# Patient Record
Sex: Male | Born: 1965
Health system: Southern US, Community
[De-identification: ages and names within clinical notes are randomized; demographics above are authoritative.]

## PROBLEM LIST (undated history)

## (undated) HISTORY — PX: VASECTOMY: SHX75

---

## 2004-02-13 ENCOUNTER — Ambulatory Visit: Payer: Self-pay | Admitting: Family Medicine

## 2004-02-19 ENCOUNTER — Ambulatory Visit: Payer: Self-pay | Admitting: Unknown Physician Specialty

## 2005-01-16 ENCOUNTER — Ambulatory Visit: Payer: Self-pay | Admitting: Family Medicine

## 2006-12-14 IMAGING — NM NUCLEAR MEDICINE HEPATOHBILIARY INCLUDE GB
3 series · 21 of 21 positions shown · non-contrast
Comparison: none

REASON FOR EXAM: Epigastric abdominal pain
COMMENTS:

[Series 1: gallbladder dynamic (results) · 4.80mm/px · 6 of 60 frames shown]
[frame 6/60]
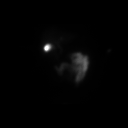
[frame 16/60]
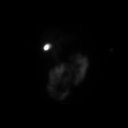
[frame 26/60]
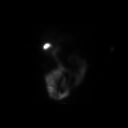
[frame 36/60]
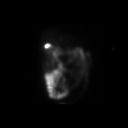
[frame 46/60]
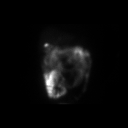
[frame 56/60]
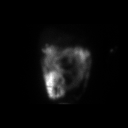

[Series 1: gallbladder statics · 4.80mm/px · 9 of 9 slices shown]
[im 1/9]
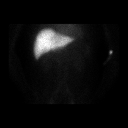
[im 2/9]
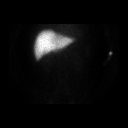
[im 3/9]
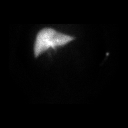
[im 4/9]
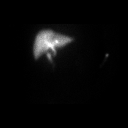
[im 5/9]
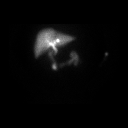
[im 6/9]
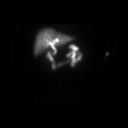
[im 7/9]
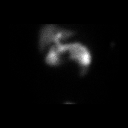
[im 8/9]
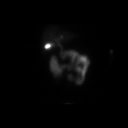
[im 9/9]
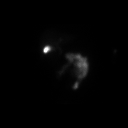

[Series 1: gallbladder dynamic · 4.80mm/px · 6 of 60 frames shown]
[frame 6/60]
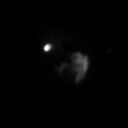
[frame 16/60]
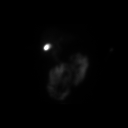
[frame 26/60]
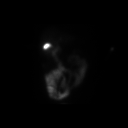
[frame 36/60]
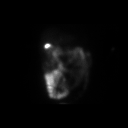
[frame 46/60]
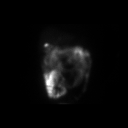
[frame 56/60]
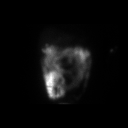

[21 of 21 positions shown; findings below may reference images not displayed]

PROCEDURE:     NM  - NM HEPATO WITH GB EJECT FRACTION  - January 16, 2005 [DATE]

RESULT:     The patient received 7.93 millicuries of technetium-labeled
Choletec and standard anterior images of the abdomen were obtained.  Diffuse
radiotracer activity is appreciated throughout the liver.  Drainage of
radiotracer into the common bile duct is demonstrated at 10 minutes with
increased intensity appreciated in the intrahepatic and extrahepatic biliary
system.  Drainage of radiotracer into the small bowel is appreciated at 15
minutes with increased intensity demonstrated and peristalsis.  The
gallbladder is appreciated filling the radiotracer at approximately 50
minutes with increased activity demonstrated at 60 minutes.

The patient received 1.7 micrograms of sincalide IV over 30 minutes and a
gallbladder ejection fraction was measured at 86%.
IMPRESSION: Hepatobiliary scan as well as a gallbladder ejection fraction within normal
limits as described above.

## 2010-12-22 ENCOUNTER — Ambulatory Visit: Payer: Self-pay | Admitting: Unknown Physician Specialty

## 2010-12-22 LAB — HM COLONOSCOPY

## 2014-03-13 LAB — LIPID PANEL
CHOLESTEROL: 183 mg/dL (ref 0–200)
HDL: 50 mg/dL (ref 35–70)
LDL Cholesterol: 123 mg/dL
LDl/HDL Ratio: 2.5
Triglycerides: 48 mg/dL (ref 40–160)

## 2014-03-13 LAB — TSH: TSH: 1.82 u[IU]/mL (ref 0.41–5.90)

## 2014-03-13 LAB — BASIC METABOLIC PANEL
BUN: 25 mg/dL — AB (ref 4–21)
CREATININE: 1 mg/dL (ref 0.6–1.3)
Glucose: 95 mg/dL
Potassium: 5.1 mmol/L (ref 3.4–5.3)
SODIUM: 139 mmol/L (ref 137–147)

## 2014-03-13 LAB — CBC AND DIFFERENTIAL
HCT: 47 % (ref 41–53)
HEMOGLOBIN: 16 g/dL (ref 13.5–17.5)
Neutrophils Absolute: 3 /uL
Platelets: 249 10*3/uL (ref 150–399)
WBC: 4.6 10*3/mL

## 2014-03-13 LAB — HEPATIC FUNCTION PANEL
ALT: 16 U/L (ref 10–40)
AST: 21 U/L (ref 14–40)
Alkaline Phosphatase: 54 U/L (ref 25–125)

## 2014-03-13 LAB — PSA: PSA: 1

## 2014-06-21 ENCOUNTER — Other Ambulatory Visit: Payer: Self-pay | Admitting: Family Medicine

## 2014-09-06 DIAGNOSIS — K219 Gastro-esophageal reflux disease without esophagitis: Secondary | ICD-10-CM | POA: Insufficient documentation

## 2014-09-06 DIAGNOSIS — M722 Plantar fascial fibromatosis: Secondary | ICD-10-CM | POA: Insufficient documentation

## 2014-09-06 DIAGNOSIS — F411 Generalized anxiety disorder: Secondary | ICD-10-CM | POA: Insufficient documentation

## 2014-09-06 DIAGNOSIS — G4700 Insomnia, unspecified: Secondary | ICD-10-CM | POA: Insufficient documentation

## 2014-09-06 DIAGNOSIS — N400 Enlarged prostate without lower urinary tract symptoms: Secondary | ICD-10-CM | POA: Insufficient documentation

## 2014-09-12 ENCOUNTER — Encounter: Payer: Self-pay | Admitting: Family Medicine

## 2014-09-12 ENCOUNTER — Ambulatory Visit (INDEPENDENT_AMBULATORY_CARE_PROVIDER_SITE_OTHER): Payer: BLUE CROSS/BLUE SHIELD | Admitting: Family Medicine

## 2014-09-12 VITALS — BP 112/70 | HR 72 | Temp 98.3°F | Resp 16 | Wt 190.0 lb

## 2014-09-12 DIAGNOSIS — F411 Generalized anxiety disorder: Secondary | ICD-10-CM

## 2014-09-12 DIAGNOSIS — G4700 Insomnia, unspecified: Secondary | ICD-10-CM

## 2014-09-12 MED ORDER — CLONAZEPAM 1 MG PO TABS: 1.0000 mg | ORAL_TABLET | Freq: Every day | ORAL | Status: DC

## 2014-09-12 NOTE — Progress Notes (Signed)
Patient ID: William Thompson, male   DOB: 16-Apr-1965, 49 y.o.   MRN: [id number]    Subjective:  HPI Pt reports that he is here today for a 6 month follow up for anxiety. He needs a refill on his clonazepam. He reports that he takes it to sleep and for prostatitis, and his urologist told him to stay on it.   Prior to Admission medications   Medication Sig Start Date End Date Taking? Authorizing Provider  aspirin 81 MG tablet Take by mouth. 03/25/11  Yes Historical Provider, MD  clonazePAM (KLONOPIN) 1 MG tablet Take by mouth. 03/27/14  Yes Historical Provider, MD  DiphenhydrAMINE HCl, Sleep, 50 MG CAPS Take by mouth.   Yes Historical Provider, MD  PRILOSEC OTC 20 MG tablet TAKE ONE TABLET TWICE A DAY 06/21/14  Yes Richard Hulen Shouts., MD    Patient Active Problem List   Diagnosis Date Noted  . Benign fibroma of prostate 09/06/2014  . Insomnia, persistent 09/06/2014  . Anxiety, generalized 09/06/2014  . Acid reflux 09/06/2014  . Plantar fasciitis 09/06/2014    History reviewed. No pertinent past medical history.  Social History   Social History  . Marital Status: Married    Spouse Name: N/A  . Number of Children: N/A  . Years of Education: N/A   Occupational History  . Not on file.   Social History Main Topics  . Smoking status: Never Smoker   . Smokeless tobacco: Not on file  . Alcohol Use: No  . Drug Use: No  . Sexual Activity: Not on file   Other Topics Concern  . Not on file   Social History Narrative    No Known Allergies  Review of Systems  Constitutional: Negative.   HENT: Positive for congestion.        He feels like he is getting a head cold.   Eyes: Negative.   Respiratory: Negative.   Cardiovascular: Negative.   Gastrointestinal: Negative.   Genitourinary: Negative.   Musculoskeletal: Negative.   Skin: Negative.   Neurological: Negative.   Endo/Heme/Allergies: Negative.   Psychiatric/Behavioral: Negative.     Immunization History    Administered Date(s) Administered  . Tdap 09/07/2007   Objective:  BP 112/70 mmHg  Pulse 72  Temp(Src) 98.3 F (36.8 C) (Oral)  Resp 16  Wt 190 lb (86.183 kg)  Physical Exam  Constitutional: He is oriented to person, place, and time and well-developed, well-nourished, and in no distress.  HENT:  Head: Normocephalic and atraumatic.  Right Ear: External ear normal.  Left Ear: External ear normal.  Nose: Nose normal.  Eyes: Conjunctivae are normal.  Neck: Neck supple.  Cardiovascular: Normal rate, regular rhythm and normal heart sounds.   Pulmonary/Chest: Effort normal and breath sounds normal.  Abdominal: Soft.  Neurological: He is alert and oriented to person, place, and time.  Skin: Skin is warm and dry.  Psychiatric: Mood, memory, affect and judgment normal.    Lab Results  Component Value Date   WBC 4.6 03/13/2014   HGB 16.0 03/13/2014   HCT 47 03/13/2014   PLT 249 03/13/2014   CHOL 183 03/13/2014   TRIG 48 03/13/2014   HDL 50 03/13/2014   LDLCALC 123 03/13/2014   TSH 1.82 03/13/2014   PSA 1.0 03/13/2014    CMP     Component Value Date/Time   NA 139 03/13/2014   K 5.1 03/13/2014   BUN 25* 03/13/2014   CREATININE 1.0 03/13/2014   AST 21 03/13/2014  ALT 16 03/13/2014   ALKPHOS 54 03/13/2014    Assessment and Plan :  Chronic insomnia and chronic anxiety Both improved and controlled with present medications. Refill for 6 months. GERD Chronic issue in this slender patient. Consider trying Zantac twice a day instead of omeprazole. Discussed new findings about chronic PPI use.CPE 6 months.  Julieanne Manson MD [facility] 09/12/2014 8:25 AM

## 2015-03-13 ENCOUNTER — Other Ambulatory Visit: Payer: Self-pay | Admitting: Family Medicine

## 2015-03-14 NOTE — Telephone Encounter (Signed)
Printed, please fax or call in to pharmacy. Thank you.   

## 2015-03-14 NOTE — Telephone Encounter (Signed)
Dr. Judie PetitM  This is a patient of Dr Reece AgarG.  He was seen in Sept for his anxiety.  Can you please refill his Clonopin 1 mg 1 q hs, may take q 6 h prn.  # 60 Thanks ED

## 2015-03-18 ENCOUNTER — Ambulatory Visit (INDEPENDENT_AMBULATORY_CARE_PROVIDER_SITE_OTHER): Payer: BLUE CROSS/BLUE SHIELD | Admitting: Family Medicine

## 2015-03-18 ENCOUNTER — Encounter: Payer: Self-pay | Admitting: Family Medicine

## 2015-03-18 ENCOUNTER — Telehealth: Payer: Self-pay

## 2015-03-18 VITALS — BP 104/64 | HR 64 | Temp 98.1°F | Resp 12 | Ht 71.5 in | Wt 194.0 lb

## 2015-03-18 DIAGNOSIS — Z125 Encounter for screening for malignant neoplasm of prostate: Secondary | ICD-10-CM | POA: Diagnosis not present

## 2015-03-18 DIAGNOSIS — Z8042 Family history of malignant neoplasm of prostate: Secondary | ICD-10-CM | POA: Diagnosis not present

## 2015-03-18 DIAGNOSIS — Z0000 Encounter for general adult medical examination without abnormal findings: Secondary | ICD-10-CM | POA: Diagnosis not present

## 2015-03-18 DIAGNOSIS — K219 Gastro-esophageal reflux disease without esophagitis: Secondary | ICD-10-CM

## 2015-03-18 DIAGNOSIS — Z1211 Encounter for screening for malignant neoplasm of colon: Secondary | ICD-10-CM | POA: Diagnosis not present

## 2015-03-18 MED ORDER — OMEPRAZOLE 20 MG PO CPDR: 20.0000 mg | DELAYED_RELEASE_CAPSULE | Freq: Every day | ORAL | Status: DC

## 2015-03-18 MED ORDER — CLONAZEPAM 1 MG PO TABS: 1.0000 mg | ORAL_TABLET | Freq: Two times a day (BID) | ORAL | Status: DC | PRN

## 2015-03-18 NOTE — Progress Notes (Signed)
Patient ID: William CornerJoseph A Hageman, male   DOB: 1965/04/11, 50 y.o.   MRN: [id number]  Visit Date: 03/18/2015  Today's Provider: Megan Mansichard Kwynn Schlotter Jr, MD   Chief Complaint  Patient presents with  . Annual Exam   Subjective:  William Thompson is a 50 y.o. male who presents today for health maintenance and complete physical. He feels well. He reports exercising runs mainly. He reports he is sleeping well. Immunization History  Administered Date(s) Administered  . Influenza-Unspecified 11/06/2014  . Tdap 09/07/2007   Colonoscopy 12/22/10-internal hemorrhoids  Review of Systems  Constitutional: Negative.   HENT: Negative.   Eyes: Negative.   Respiratory: Negative.   Cardiovascular: Negative.   Endocrine: Negative.   Genitourinary: Negative.   Musculoskeletal: Negative.   Skin: Negative.   Allergic/Immunologic: Negative.   Neurological: Negative.   Hematological: Negative.   Psychiatric/Behavioral: Negative.     Social History   Social History  . Marital Status: Married    Spouse Name: N/A  . Number of Children: N/A  . Years of Education: N/A   Occupational History  . Not on file.   Social History Main Topics  . Smoking status: Never Smoker   . Smokeless tobacco: Never Used  . Alcohol Use: No  . Drug Use: No  . Sexual Activity: Not on file   Other Topics Concern  . Not on file   Social History Narrative    Patient Active Problem List   Diagnosis Date Noted  . Benign fibroma of prostate 09/06/2014  . Insomnia, persistent 09/06/2014  . Anxiety, generalized 09/06/2014  . Acid reflux 09/06/2014  . Plantar fasciitis 09/06/2014    Past Surgical History  Procedure Laterality Date  . Vasectomy      His family history includes Arthritis in his father; Heart disease in his father; Prostate cancer in his brother and father; Stroke in his mother.    Outpatient Prescriptions Prior to Visit  Medication Sig Dispense Refill  . aspirin 81 MG tablet Take by mouth.    .  clonazePAM (KLONOPIN) 1 MG tablet TAKE ONE TABLET AT BEDTIME AS NEEDED FOR SLEEP--MAY TAKE ONE EVERY SIX HOURS AS NEEDED 60 tablet 0  . DiphenhydrAMINE HCl, Sleep, 50 MG CAPS Take by mouth.    Marland Kitchen. PRILOSEC OTC 20 MG tablet TAKE ONE TABLET TWICE A DAY 60 tablet 12   No facility-administered medications prior to visit.    Patient Care Team: Maple Hudsonichard L Arica Bevilacqua Jr., MD as PCP - General (Family Medicine)     Objective:   Vitals:  Filed Vitals:   03/18/15 0924  BP: 104/64  Pulse: 64  Temp: 98.1 F (36.7 C)  Resp: 12  Height: 5' 11.5" (1.816 m)  Weight: 194 lb (87.998 kg)    Physical Exam  Constitutional: He is oriented to person, place, and time. He appears well-developed and well-nourished.  HENT:  Head: Normocephalic and atraumatic.  Right Ear: External ear normal.  Left Ear: External ear normal.  Mouth/Throat: Oropharynx is clear and moist.  Eyes: Conjunctivae are normal. Pupils are equal, round, and reactive to light.  Neck: Normal range of motion. Neck supple.  Cardiovascular: Normal rate, regular rhythm, normal heart sounds and intact distal pulses.   No murmur heard. Pulmonary/Chest: Effort normal and breath sounds normal. No respiratory distress. He has no wheezes.  Abdominal: Soft. There is no tenderness. There is no rebound.  Genitourinary: Penis normal. No penile tenderness.  Musculoskeletal: Normal range of motion. He exhibits no edema or tenderness.  Neurological:  He is alert and oriented to person, place, and time.  Skin: Skin is warm and dry. No rash noted. No erythema.  Psychiatric: He has a normal mood and affect. His behavior is normal. Judgment and thought content normal.     Depression Screen PHQ 2/9 Scores 03/18/2015 09/12/2014  PHQ - 2 Score 0 0      Assessment & Plan:   1. Annual physical exam Check routine labs. Check PSA level. Epworth was done today and score was 9.  2. Prostate cancer screening  3. Colon cancer screening  4. Gastroesophageal  reflux disease, esophagitis presence not specified Patient takes Omeprazole but not twice daily, will not need to fill out PA for twice daily right now. Will re send RX for 1 tablet daily, pending insurance acceptance. Symptoms stable.  5. Family history of prostate cancer   Patient was seen and examined by Dr. Bosie Clos and note was scribed by Samara Deist, RMA.

## 2015-03-18 NOTE — Telephone Encounter (Signed)
PA form been filled out, seen patient today and will discuss what needs to be done-aa

## 2015-03-18 NOTE — Telephone Encounter (Signed)
Will re send RX for 1 tablet daily patient not using it twice daily and see what insurance will say. Will not send in PA form for twice daily-aa

## 2015-03-19 LAB — CBC WITH DIFFERENTIAL/PLATELET
BASOS ABS: 0 10*3/uL (ref 0.0–0.2)
Basos: 0 %
EOS (ABSOLUTE): 0.1 10*3/uL (ref 0.0–0.4)
Eos: 2 %
Hematocrit: 44 % (ref 37.5–51.0)
Hemoglobin: 14.9 g/dL (ref 12.6–17.7)
IMMATURE GRANS (ABS): 0 10*3/uL (ref 0.0–0.1)
IMMATURE GRANULOCYTES: 0 %
LYMPHS: 26 %
Lymphocytes Absolute: 1.3 10*3/uL (ref 0.7–3.1)
MCH: 29.3 pg (ref 26.6–33.0)
MCHC: 33.9 g/dL (ref 31.5–35.7)
MCV: 87 fL (ref 79–97)
MONOS ABS: 0.6 10*3/uL (ref 0.1–0.9)
Monocytes: 12 %
NEUTROS PCT: 60 %
Neutrophils Absolute: 3.1 10*3/uL (ref 1.4–7.0)
PLATELETS: 239 10*3/uL (ref 150–379)
RBC: 5.08 x10E6/uL (ref 4.14–5.80)
RDW: 13 % (ref 12.3–15.4)
WBC: 5.2 10*3/uL (ref 3.4–10.8)

## 2015-03-19 LAB — COMPREHENSIVE METABOLIC PANEL
ALT: 19 IU/L (ref 0–44)
AST: 22 IU/L (ref 0–40)
Albumin/Globulin Ratio: 2 (ref 1.2–2.2)
Albumin: 4.3 g/dL (ref 3.5–5.5)
Alkaline Phosphatase: 53 IU/L (ref 39–117)
BUN/Creatinine Ratio: 20 (ref 9–20)
BUN: 16 mg/dL (ref 6–24)
Bilirubin Total: 0.7 mg/dL (ref 0.0–1.2)
CALCIUM: 9.2 mg/dL (ref 8.7–10.2)
CHLORIDE: 103 mmol/L (ref 96–106)
CO2: 24 mmol/L (ref 18–29)
Creatinine, Ser: 0.8 mg/dL (ref 0.76–1.27)
GFR, EST AFRICAN AMERICAN: 122 mL/min/{1.73_m2} (ref >59)
GFR, EST NON AFRICAN AMERICAN: 106 mL/min/{1.73_m2} (ref >59)
GLUCOSE: 99 mg/dL (ref 65–99)
Globulin, Total: 2.2 g/dL (ref 1.5–4.5)
POTASSIUM: 4.8 mmol/L (ref 3.5–5.2)
Sodium: 142 mmol/L (ref 134–144)
TOTAL PROTEIN: 6.5 g/dL (ref 6.0–8.5)

## 2015-03-19 LAB — TSH: TSH: 1.18 u[IU]/mL (ref 0.450–4.500)

## 2015-03-19 LAB — LIPID PANEL WITH LDL/HDL RATIO
Cholesterol, Total: 156 mg/dL (ref 100–199)
HDL: 53 mg/dL (ref >39)
LDL Calculated: 96 mg/dL (ref 0–99)
LDl/HDL Ratio: 1.8 ratio units (ref 0.0–3.6)
TRIGLYCERIDES: 35 mg/dL (ref 0–149)
VLDL CHOLESTEROL CAL: 7 mg/dL (ref 5–40)

## 2015-03-19 LAB — PSA: PROSTATE SPECIFIC AG, SERUM: 1.1 ng/mL (ref 0.0–4.0)

## 2015-03-20 ENCOUNTER — Telehealth: Payer: Self-pay

## 2015-03-20 NOTE — Telephone Encounter (Signed)
-----   Message from Maple Hudsonichard L Gilbert Jr., MD sent at 03/19/2015 10:24 AM EDT ----- Labs all within normal limits.

## 2015-03-20 NOTE — Telephone Encounter (Signed)
Left message to call back  

## 2015-03-20 NOTE — Telephone Encounter (Signed)
Patient advised as directed below. Patient verbalized understanding.  

## 2015-10-10 ENCOUNTER — Other Ambulatory Visit: Payer: Self-pay

## 2015-10-11 MED ORDER — CLONAZEPAM 1 MG PO TABS: 1.0000 mg | ORAL_TABLET | Freq: Two times a day (BID) | ORAL | 5 refills | Status: DC | PRN

## 2015-10-15 ENCOUNTER — Other Ambulatory Visit: Payer: Self-pay | Admitting: Family Medicine

## 2015-10-15 DIAGNOSIS — M722 Plantar fascial fibromatosis: Secondary | ICD-10-CM | POA: Diagnosis not present

## 2015-10-15 DIAGNOSIS — M79671 Pain in right foot: Secondary | ICD-10-CM | POA: Diagnosis not present

## 2015-10-15 DIAGNOSIS — M79672 Pain in left foot: Secondary | ICD-10-CM | POA: Diagnosis not present

## 2015-11-25 DIAGNOSIS — M25521 Pain in right elbow: Secondary | ICD-10-CM | POA: Diagnosis not present

## 2015-11-25 DIAGNOSIS — M7701 Medial epicondylitis, right elbow: Secondary | ICD-10-CM | POA: Diagnosis not present

## 2016-01-15 DIAGNOSIS — L570 Actinic keratosis: Secondary | ICD-10-CM | POA: Diagnosis not present

## 2016-01-15 DIAGNOSIS — Z85828 Personal history of other malignant neoplasm of skin: Secondary | ICD-10-CM | POA: Diagnosis not present

## 2016-01-15 DIAGNOSIS — L989 Disorder of the skin and subcutaneous tissue, unspecified: Secondary | ICD-10-CM | POA: Diagnosis not present

## 2016-01-15 DIAGNOSIS — D485 Neoplasm of uncertain behavior of skin: Secondary | ICD-10-CM | POA: Diagnosis not present

## 2016-03-18 ENCOUNTER — Encounter: Payer: Self-pay | Admitting: Family Medicine

## 2016-03-18 ENCOUNTER — Ambulatory Visit (INDEPENDENT_AMBULATORY_CARE_PROVIDER_SITE_OTHER): Payer: BLUE CROSS/BLUE SHIELD | Admitting: Family Medicine

## 2016-03-18 VITALS — BP 112/72 | HR 78 | Temp 98.4°F | Resp 14 | Ht 71.0 in | Wt 196.0 lb

## 2016-03-18 DIAGNOSIS — G4700 Insomnia, unspecified: Secondary | ICD-10-CM

## 2016-03-18 DIAGNOSIS — M722 Plantar fascial fibromatosis: Secondary | ICD-10-CM

## 2016-03-18 DIAGNOSIS — Z0000 Encounter for general adult medical examination without abnormal findings: Secondary | ICD-10-CM | POA: Diagnosis not present

## 2016-03-18 DIAGNOSIS — K219 Gastro-esophageal reflux disease without esophagitis: Secondary | ICD-10-CM

## 2016-03-18 DIAGNOSIS — F411 Generalized anxiety disorder: Secondary | ICD-10-CM | POA: Diagnosis not present

## 2016-03-18 DIAGNOSIS — Z1211 Encounter for screening for malignant neoplasm of colon: Secondary | ICD-10-CM

## 2016-03-18 DIAGNOSIS — Z125 Encounter for screening for malignant neoplasm of prostate: Secondary | ICD-10-CM | POA: Diagnosis not present

## 2016-03-18 LAB — POC HEMOCCULT BLD/STL (OFFICE/1-CARD/DIAGNOSTIC): Fecal Occult Blood, POC: NEGATIVE

## 2016-03-18 LAB — POCT URINALYSIS DIPSTICK
Bilirubin, UA: NEGATIVE
Blood, UA: NEGATIVE
Glucose, UA: NEGATIVE
Ketones, UA: NEGATIVE
Leukocytes, UA: NEGATIVE
Nitrite, UA: NEGATIVE
Protein, UA: NEGATIVE
Spec Grav, UA: 1.005
Urobilinogen, UA: 0.2
pH, UA: 6.5

## 2016-03-18 MED ORDER — ETODOLAC 500 MG PO TABS
500.0000 mg | ORAL_TABLET | Freq: Two times a day (BID) | ORAL | 12 refills | Status: DC
Start: 2016-03-18 — End: 2017-03-23

## 2016-03-18 NOTE — Progress Notes (Addendum)
Patient: William Thompson, Male    DOB: [birth year], 51 y.o.   MRN: [id number] Visit Date: 03/18/2016  Today's Provider: Megan Mansichard Alaina Donati Jr, MD   Chief Complaint  Patient presents with  . Annual Exam   Subjective:  William CornerJoseph A Coe is a 51 y.o. male who presents today for health maintenance and complete physical. He feels fairly well. He reports exercising walking 2 to 3 days a week for about an hour. He reports he is sleeping fairly well. Immunization History  Administered Date(s) Administered  . Influenza-Unspecified 11/06/2014  . Tdap 09/07/2007   Last colonoscopy 12/22/10 internal hemorrhoids otherwise normal, repeat in 10 years-2022.  Review of Systems  Constitutional: Positive for fatigue.  HENT: Positive for tinnitus.   Eyes: Negative.   Respiratory: Negative.   Cardiovascular: Negative.   Gastrointestinal: Positive for blood in stool.  Endocrine: Negative.   Genitourinary: Negative.   Musculoskeletal: Positive for arthralgias and myalgias.  Skin: Negative.   Allergic/Immunologic: Negative.   Neurological: Negative.   Hematological: Negative.   Psychiatric/Behavioral: Positive for sleep disturbance.  Bright red blood on TP last week.  Social History   Social History  . Marital status: Married    Spouse name: N/A  . Number of children: N/A  . Years of education: N/A   Occupational History  . Not on file.   Social History Main Topics  . Smoking status: Never Smoker  . Smokeless tobacco: Never Used  . Alcohol use No  . Drug use: No  . Sexual activity: Yes    Birth control/ protection: None   Other Topics Concern  . Not on file   Social History Narrative  . No narrative on file    Patient Active Problem List   Diagnosis Date Noted  . Benign fibroma of prostate 09/06/2014  . Insomnia, persistent 09/06/2014  . Anxiety, generalized 09/06/2014  . Acid reflux 09/06/2014  . Plantar fasciitis 09/06/2014    Past Surgical History:  Procedure Laterality Date   . VASECTOMY      His family history includes Arthritis in his father; Heart disease in his father; Prostate cancer in his brother and father; Stroke in his mother.     Outpatient Encounter Prescriptions as of 03/18/2016  Medication Sig Note  . clonazePAM (KLONOPIN) 1 MG tablet Take 1 tablet (1 mg total) by mouth 2 (two) times daily as needed for anxiety.   Marland Kitchen. omeprazole (PRILOSEC) 20 MG capsule Take 1 capsule (20 mg total) by mouth daily.   Marland Kitchen. acetaZOLAMIDE (DIAMOX) 500 MG capsule TAKE ONE CAPSULE TWICE A DAY (Patient not taking: Reported on 03/18/2016)   . aspirin 81 MG tablet Take by mouth. 09/06/2014: Received from: [facility]  . [DISCONTINUED] DiphenhydrAMINE HCl, Sleep, 50 MG CAPS Take by mouth. 09/06/2014: Received from: [facility]   No facility-administered encounter medications on file as of 03/18/2016.     Patient Care Team: Maple Hudsonichard L Joshoa Shawler Jr., MD as PCP - General (Family Medicine)      Objective:   Vitals:  Vitals:   03/18/16 0916  BP: 112/72  Pulse: 78  Resp: 14  Temp: 98.4 F (36.9 C)  Weight: 196 lb (88.9 kg)  Height: 5\' 11"  (1.803 m)    Physical Exam  Constitutional: He is oriented to person, place, and time. He appears well-developed and well-nourished.  HENT:  Head: Normocephalic and atraumatic.  Right Ear: External ear normal.  Left Ear: External ear normal.  Mouth/Throat: Oropharynx is clear and moist.  Eyes: Conjunctivae  are normal. Pupils are equal, round, and reactive to light.  Neck: Normal range of motion. Neck supple.  Cardiovascular: Normal rate, regular rhythm, normal heart sounds and intact distal pulses.   No murmur heard. Pulmonary/Chest: Effort normal and breath sounds normal. No respiratory distress. He has no wheezes.  Abdominal: Soft. He exhibits no distension. There is no tenderness.  Genitourinary: Rectum normal, prostate normal and penis normal. Rectal exam shows guaiac negative stool. No penile  tenderness.  Musculoskeletal: He exhibits no edema or tenderness.  Neurological: He is alert and oriented to person, place, and time. No cranial nerve deficit. Coordination normal.  Skin: No erythema.  Psychiatric: He has a normal mood and affect. His behavior is normal. Judgment and thought content normal.     Depression Screen PHQ 2/9 Scores 03/18/2016 03/18/2015 09/12/2014  PHQ - 2 Score 0 0 0  PHQ- 9 Score 3 - -   Assessment & Plan:    1. Annual physical exam - POCT Urinalysis Dipstick - CBC w/Diff/Platelet - Comprehensive metabolic panel - Lipid Panel With LDL/HDL Ratio - TSH  2. Prostate cancer screening - PSA  3. Colon cancer screening - POC Hemoccult Bld/Stl (1-Cd Office Dx)  4. Gastroesophageal reflux disease, esophagitis presence not specified Stable. Takes Omeprazole as needed.  5. Insomnia, persistent Stable. Takes Clonazepam daily.  6. Anxiety, generalized Stable. 7. Plantar fascitis Treat with Etodolac. Follow as needed. 8.Right Medial Epicondylitis/left lateral epicondylitis HPI, Exam and A&P transcribed under direction and in the presence of Julieanne Manson, MD. I have done the exam and reviewed the chart and it is accurate to the best of my knowledge. Dentist has been used and  any errors in dictation or transcription are unintentional. Julieanne Manson M.D. [facility]

## 2016-03-20 ENCOUNTER — Other Ambulatory Visit: Payer: Self-pay | Admitting: Family Medicine

## 2016-05-05 DIAGNOSIS — Z125 Encounter for screening for malignant neoplasm of prostate: Secondary | ICD-10-CM | POA: Diagnosis not present

## 2016-05-05 DIAGNOSIS — Z0000 Encounter for general adult medical examination without abnormal findings: Secondary | ICD-10-CM | POA: Diagnosis not present

## 2016-05-06 LAB — CBC WITH DIFFERENTIAL/PLATELET
BASOS: 0 %
Basophils Absolute: 0 10*3/uL (ref 0.0–0.2)
EOS (ABSOLUTE): 0.1 10*3/uL (ref 0.0–0.4)
Eos: 2 %
Hematocrit: 46.6 % (ref 37.5–51.0)
Hemoglobin: 15.6 g/dL (ref 13.0–17.7)
IMMATURE GRANS (ABS): 0 10*3/uL (ref 0.0–0.1)
IMMATURE GRANULOCYTES: 1 %
Lymphocytes Absolute: 1.6 10*3/uL (ref 0.7–3.1)
Lymphs: 29 %
MCH: 29.8 pg (ref 26.6–33.0)
MCHC: 33.5 g/dL (ref 31.5–35.7)
MCV: 89 fL (ref 79–97)
MONOCYTES: 10 %
Monocytes Absolute: 0.6 10*3/uL (ref 0.1–0.9)
NEUTROS PCT: 58 %
Neutrophils Absolute: 3.4 10*3/uL (ref 1.4–7.0)
Platelets: 217 10*3/uL (ref 150–379)
RBC: 5.23 x10E6/uL (ref 4.14–5.80)
RDW: 13 % (ref 12.3–15.4)
WBC: 5.8 10*3/uL (ref 3.4–10.8)

## 2016-05-06 LAB — COMPREHENSIVE METABOLIC PANEL
A/G RATIO: 2 (ref 1.2–2.2)
ALT: 37 IU/L (ref 0–44)
AST: 26 IU/L (ref 0–40)
Albumin: 4.4 g/dL (ref 3.5–5.5)
Alkaline Phosphatase: 60 IU/L (ref 39–117)
BUN/Creatinine Ratio: 37 — ABNORMAL HIGH (ref 9–20)
BUN: 33 mg/dL — ABNORMAL HIGH (ref 6–24)
Bilirubin Total: 0.3 mg/dL (ref 0.0–1.2)
CALCIUM: 9.4 mg/dL (ref 8.7–10.2)
CO2: 23 mmol/L (ref 18–29)
CREATININE: 0.89 mg/dL (ref 0.76–1.27)
Chloride: 100 mmol/L (ref 96–106)
GFR, EST AFRICAN AMERICAN: 116 mL/min/{1.73_m2} (ref >59)
GFR, EST NON AFRICAN AMERICAN: 100 mL/min/{1.73_m2} (ref >59)
GLOBULIN, TOTAL: 2.2 g/dL (ref 1.5–4.5)
Glucose: 105 mg/dL — ABNORMAL HIGH (ref 65–99)
Potassium: 4.8 mmol/L (ref 3.5–5.2)
Sodium: 135 mmol/L (ref 134–144)
TOTAL PROTEIN: 6.6 g/dL (ref 6.0–8.5)

## 2016-05-06 LAB — LIPID PANEL WITH LDL/HDL RATIO
Cholesterol, Total: 175 mg/dL (ref 100–199)
HDL: 44 mg/dL (ref >39)
LDL Calculated: 115 mg/dL — ABNORMAL HIGH (ref 0–99)
LDL/HDL RATIO: 2.6 ratio (ref 0.0–3.6)
Triglycerides: 82 mg/dL (ref 0–149)
VLDL CHOLESTEROL CAL: 16 mg/dL (ref 5–40)

## 2016-05-06 LAB — PSA: PROSTATE SPECIFIC AG, SERUM: 1.1 ng/mL (ref 0.0–4.0)

## 2016-05-06 LAB — TSH: TSH: 2.09 u[IU]/mL (ref 0.450–4.500)

## 2016-05-07 ENCOUNTER — Telehealth: Payer: Self-pay

## 2016-05-07 NOTE — Telephone Encounter (Signed)
Advised patient of results. Patient reports that he wasn't fasting. He forgot to mention that he had something to eat before he had his labs drawn.

## 2016-05-07 NOTE — Telephone Encounter (Signed)
-----   Message from Maple Hudsonichard L Gilbert Jr., MD sent at 05/07/2016  8:44 AM EDT ----- Labs stable. Borderline prediabetic. Continue to work on diet and exercise.

## 2016-05-07 NOTE — Telephone Encounter (Signed)
Left message to call back  

## 2016-05-14 ENCOUNTER — Other Ambulatory Visit: Payer: Self-pay | Admitting: Family Medicine

## 2016-10-02 DIAGNOSIS — M7701 Medial epicondylitis, right elbow: Secondary | ICD-10-CM | POA: Diagnosis not present

## 2016-11-13 DIAGNOSIS — L821 Other seborrheic keratosis: Secondary | ICD-10-CM | POA: Diagnosis not present

## 2016-12-04 ENCOUNTER — Other Ambulatory Visit: Payer: Self-pay | Admitting: Family Medicine

## 2017-01-13 DIAGNOSIS — Z85828 Personal history of other malignant neoplasm of skin: Secondary | ICD-10-CM | POA: Diagnosis not present

## 2017-02-04 ENCOUNTER — Telehealth: Payer: Self-pay | Admitting: Family Medicine

## 2017-02-04 DIAGNOSIS — Z7189 Other specified counseling: Secondary | ICD-10-CM

## 2017-02-04 DIAGNOSIS — H9319 Tinnitus, unspecified ear: Secondary | ICD-10-CM | POA: Diagnosis not present

## 2017-02-04 NOTE — Telephone Encounter (Signed)
Please check on this--referrral for this--cardiac risk assessment.

## 2017-02-04 NOTE — Telephone Encounter (Signed)
Did not see where a referral was placed. Order placed. Tried to call pt to advise but LMTCB.

## 2017-02-04 NOTE — Telephone Encounter (Signed)
Pt advised.

## 2017-02-04 NOTE — Telephone Encounter (Signed)
Pt stated that he discussed with Dr. Sullivan LoneGilbert getting a referral for a coronary calcium test and that Dr. Sullivan LoneGilbert was going to send a referral for that to [facility]. Pt stated he was advised [facility] would contact him to schedule but he hasn't heard from their office. Pt wanted to see what he needed to do to get this scheduled. Please advise. Thanks TNP

## 2017-03-20 NOTE — Progress Notes (Signed)
Cardiology Office Note  Date:  03/23/2017   ID:  William Thompson, DOB [birth year], MRN [id number]  PCP:  Maple Hudson., MD   Chief Complaint  Patient presents with  . New Patient (Initial Visit)    Cardiac risk counseling per Dr. Sullivan Lone patient wants CT Calcium Score.     HPI:  William Thompson is a 52 year old gentleman with past medical history of Anxiety/insomnia Family history of coronary artery disease Referred by Dr. Sullivan Lone for consultation of his cardiac risk factors, cardiac risk counseling  Thought he was going to have CT coronary calcium score on today's visit Was booked for a appointment  Discussed his family history with him, father with MI, Brother with suspected PAD  He is not having any symptoms of shortness of breath or chest discomfort  CT chest 2006 No mention of coronary disease  Total chol 175, LDL 115 Glu 105  EKG personally reviewed by myself on todays visit Shows normal sinus rhythm rate 71 bpm no significant ST or T wave changes   PMH:   Anxiety/insomnia GERD  PSH:    Past Surgical History:  Procedure Laterality Date  . VASECTOMY      Current Outpatient Medications  Medication Sig Dispense Refill  . acetaZOLAMIDE (DIAMOX) 500 MG capsule TAKE ONE CAPSULE TWICE A DAY (Patient taking differently: TAKE ONE CAPSULE TWICE A DAY PRN) 60 capsule 3  . clonazePAM (KLONOPIN) 1 MG tablet Take 1 mg by mouth daily.    . diphenhydrAMINE (BENADRYL) 50 MG capsule Take by mouth.    . etodolac (LODINE) 500 MG tablet Take 500 mg by mouth as needed.    Marland Kitchen omeprazole (PRILOSEC) 20 MG capsule TAKE ONE CAPSULE DAILY 30 capsule 12   No current facility-administered medications for this visit.      Allergies:   Patient has no known allergies.   Social History:  The patient  reports that  has never smoked. he has never used smokeless tobacco. He reports that he does not drink alcohol or use drugs.   Family History:   family history includes Arthritis in  his father; Heart disease in his father; Peripheral Artery Disease in his brother; Prostate cancer in his brother and father; Stroke in his mother.    Review of Systems: Review of Systems  Constitutional: Negative.   Respiratory: Negative.   Cardiovascular: Negative.   Gastrointestinal: Negative.   Musculoskeletal: Negative.   Neurological: Negative.   Psychiatric/Behavioral: Negative.   All other systems reviewed and are negative.    PHYSICAL EXAM: VS:  BP 110/70 (BP Location: Right Arm, Patient Position: Sitting, Cuff Size: Normal)   Pulse 71   Ht 5\' 11"  (1.803 m)   Wt 203 lb 4 oz (92.2 kg)   BMI 28.35 kg/m  , BMI Body mass index is 28.35 kg/m. GEN: Well nourished, well developed, in no acute distress  HEENT: normal  Neck: no JVD, carotid bruits, or masses Cardiac: RRR; no murmurs, rubs, or gallops,no edema  Respiratory:  clear to auscultation bilaterally, normal work of breathing GI: soft, nontender, nondistended, + BS MS: no deformity or atrophy  Skin: warm and dry, no rash Neuro:  Strength and sensation are intact Psych: euthymic mood, full affect    Recent Labs: 05/05/2016: ALT 37; BUN 33; Creatinine, Ser 0.89; Hemoglobin 15.6; Platelets 217; Potassium 4.8; Sodium 135; TSH 2.090    Lipid Panel Lab Results  Component Value Date   CHOL 175 05/05/2016   HDL 44 05/05/2016   LDLCALC  115 (H) 05/05/2016   TRIG 82 05/05/2016      Wt Readings from Last 3 Encounters:  03/23/17 203 lb 4 oz (92.2 kg)  03/18/16 196 lb (88.9 kg)  03/18/15 194 lb (88 kg)       ASSESSMENT AND PLAN:  Encounter for cardiac risk counseling - Plan: EKG 12-Lead Relatively normal EKG, benign clinical exam No symptoms concerning for angina Cholesterol very reasonable For risk stratification we have ordered CT coronary calcium scoring  Family history of cardiovascular disease - Plan: CT CARDIAC SCORING Father with MI Brother with suspected PAD though details unclear CT coronary  calcium scoring ordered for risk stratification  Disposition:   F/U as needed  Addendum We called him with the prelim results of his CT chest He has punctate coronary calcified lesions in the prox/mid  LAD Given the above, we have recommended he start Crestor 5 mg daily for goal LDL less than 70  Patient was seen in consultation for Dr. Sullivan LoneGilbert and will be referred back to his office for ongoing care of the issues detailed above Home  Total encounter time more than 45 minutes  Greater than 50% was spent in counseling and coordination of care with the patient    Orders Placed This Encounter  Procedures  . CT CARDIAC SCORING  . EKG 12-Lead     Signed, Dossie Arbourim Amberlea Spagnuolo, M.D., Ph.D. 03/23/2017  [facility], [place] [phone]

## 2017-03-23 ENCOUNTER — Ambulatory Visit (INDEPENDENT_AMBULATORY_CARE_PROVIDER_SITE_OTHER)
Admission: RE | Admit: 2017-03-23 | Discharge: 2017-03-23 | Disposition: A | Discharge status: Home / Self Care | Payer: BLUE CROSS/BLUE SHIELD | Source: Ambulatory Visit | Attending: Cardiovascular Disease | Admitting: Cardiovascular Disease

## 2017-03-23 ENCOUNTER — Encounter: Payer: Self-pay | Admitting: Cardiovascular Disease

## 2017-03-23 ENCOUNTER — Other Ambulatory Visit: Payer: Self-pay | Admitting: *Deleted

## 2017-03-23 ENCOUNTER — Encounter: Payer: BLUE CROSS/BLUE SHIELD | Admitting: Family Medicine

## 2017-03-23 ENCOUNTER — Encounter: Payer: Self-pay | Admitting: Family Medicine

## 2017-03-23 ENCOUNTER — Ambulatory Visit (INDEPENDENT_AMBULATORY_CARE_PROVIDER_SITE_OTHER): Payer: BLUE CROSS/BLUE SHIELD | Admitting: Cardiovascular Disease

## 2017-03-23 VITALS — BP 110/70 | HR 71 | Ht 71.0 in | Wt 203.2 lb

## 2017-03-23 DIAGNOSIS — Z8249 Family history of ischemic heart disease and other diseases of the circulatory system: Secondary | ICD-10-CM

## 2017-03-23 DIAGNOSIS — Z7189 Other specified counseling: Secondary | ICD-10-CM | POA: Diagnosis not present

## 2017-03-23 DIAGNOSIS — E785 Hyperlipidemia, unspecified: Secondary | ICD-10-CM | POA: Insufficient documentation

## 2017-03-23 DIAGNOSIS — E782 Mixed hyperlipidemia: Secondary | ICD-10-CM

## 2017-03-23 MED ORDER — ROSUVASTATIN CALCIUM 5 MG PO TABS: 5.0000 mg | ORAL_TABLET | Freq: Every day | ORAL | 3 refills | Status: DC

## 2017-03-23 NOTE — Progress Notes (Signed)
[facility] and scheduled William Thompson for CT Cardiac Scoring this morning with arrival time of 11:15AM. Patient was agreeable with this time and information provided with location, cost, and time. Dr. Mariah MillingGollan also requested that I call Dr. Elisabeth CaraGilbert's office to see if they can get William Thompson in sooner. They will try to work William Thompson in so that he can make that appointment.

## 2017-03-23 NOTE — Telephone Encounter (Signed)
Patient came into office and states he needs a refill of his Klonopin. Patient pharmacy is [facility]. Please advise. Thank you

## 2017-03-23 NOTE — Patient Instructions (Addendum)
Medication Instructions:   Please start Crestor 5 mg daily  Labwork:  Lipid panel with LFTs in 3 months Goal LDL less than 70 given coronary calcification seen on CT scan chest  Testing/Procedures:  No further testing at this time   Follow-Up: It was a pleasure seeing you in the office today. Please call us if you have new issues that need to be addressed before your next appt.  [phone]  Your physician wants you to follow-up in: as needed  If you need a refill on your cardiac medications before your next appointment, please call your pharmacy.  For educational health videos Log in to : [email or web address] Or : [email or web address], password : [email or web address]    Testing/Procedures: CT Coronary calcium scoring $150.00  [facility] [place] office [address] (3rd floor) [place], [place]   Follow-Up: Your physician recommends that you schedule a follow-up appointment as needed.

## 2017-03-24 MED ORDER — CLONAZEPAM 1 MG PO TABS: 1.0000 mg | ORAL_TABLET | Freq: Every day | ORAL | 4 refills | Status: DC

## 2017-03-25 ENCOUNTER — Encounter: Payer: Self-pay | Admitting: Cardiovascular Disease

## 2017-04-06 ENCOUNTER — Encounter: Payer: Self-pay | Admitting: Family Medicine

## 2017-04-06 ENCOUNTER — Ambulatory Visit (INDEPENDENT_AMBULATORY_CARE_PROVIDER_SITE_OTHER): Payer: BLUE CROSS/BLUE SHIELD | Admitting: Family Medicine

## 2017-04-06 VITALS — BP 112/70 | HR 70 | Temp 98.0°F | Resp 16 | Ht 71.0 in | Wt 199.0 lb

## 2017-04-06 DIAGNOSIS — Z125 Encounter for screening for malignant neoplasm of prostate: Secondary | ICD-10-CM

## 2017-04-06 DIAGNOSIS — Z0000 Encounter for general adult medical examination without abnormal findings: Secondary | ICD-10-CM

## 2017-04-06 LAB — POCT URINALYSIS DIPSTICK
Bilirubin, UA: NEGATIVE
Blood, UA: NEGATIVE
Glucose, UA: NEGATIVE
Ketones, UA: NEGATIVE
LEUKOCYTES UA: NEGATIVE
NITRITE UA: NEGATIVE
PROTEIN UA: NEGATIVE
Spec Grav, UA: 1.01 (ref 1.010–1.025)
Urobilinogen, UA: 0.2 E.U./dL
pH, UA: 7 (ref 5.0–8.0)

## 2017-04-06 NOTE — Progress Notes (Signed)
Patient: William Thompson, Male    DOB: 1965-03-24, 51 y.o.   MRN: [id number] Visit Date: 04/06/2017  Today's Provider: Megan Mans, MD   Chief Complaint  Patient presents with  . Annual Exam   Subjective:    Annual physical exam JODECI ROARTY is a 52 y.o. male who presents today for health maintenance and complete physical. He feels well. He reports exercising but not like he used to. He reports he is sleeping poorly, but nothing new for pt.  ----------------------------------------------------------------- Colonoscopy- 12/22/10 Dr. Mechele Collin, Internal hemorrhoids. Repeat 10 years    Review of Systems  Constitutional: Negative.   HENT: Positive for tinnitus.   Eyes: Negative.   Respiratory: Negative.   Cardiovascular: Negative.   Gastrointestinal: Negative.   Endocrine: Negative.   Genitourinary: Negative.   Musculoskeletal: Negative.   Skin: Negative.   Allergic/Immunologic: Negative.   Neurological: Negative.   Hematological: Negative.   Psychiatric/Behavioral: Negative.     Social History      He  reports that he has never smoked. He has never used smokeless tobacco. He reports that he does not drink alcohol or use drugs.       Social History   Socioeconomic History  . Marital status: Married    Spouse name: Not on file  . Number of children: Not on file  . Years of education: Not on file  . Highest education level: Not on file  Occupational History  . Not on file  Social Needs  . Financial resource strain: Not on file  . Food insecurity:    Worry: Not on file    Inability: Not on file  . Transportation needs:    Medical: Not on file    Non-medical: Not on file  Tobacco Use  . Smoking status: Never Smoker  . Smokeless tobacco: Never Used  Substance and Sexual Activity  . Alcohol use: No  . Drug use: No  . Sexual activity: Yes    Birth control/protection: None  Lifestyle  . Physical activity:    Days per week: Not on file   Minutes per session: Not on file  . Stress: Not on file  Relationships  . Social connections:    Talks on phone: Not on file    Gets together: Not on file    Attends religious service: Not on file    Active member of club or organization: Not on file    Attends meetings of clubs or organizations: Not on file    Relationship status: Not on file  Other Topics Concern  . Not on file  Social History Narrative  . Not on file    History reviewed. No pertinent past medical history.   Patient Active Problem List   Diagnosis Date Noted  . Cardiac risk counseling 03/23/2017  . Family history of early CAD 03/23/2017  . Hyperlipidemia 03/23/2017  . Benign fibroma of prostate 09/06/2014  . Insomnia, persistent 09/06/2014  . Anxiety, generalized 09/06/2014  . Acid reflux 09/06/2014  . Plantar fasciitis 09/06/2014    Past Surgical History:  Procedure Laterality Date  . VASECTOMY      Family History        Family Status  Relation Name Status  . Father  Deceased at age 47  . Brother  Alive  . Mother  Deceased at age 52  . Sister  Alive  . Brother  Alive  . Brother  Alive  His family history includes Arthritis in his father; Heart disease in his father; Peripheral Artery Disease in his brother; Prostate cancer in his brother and father; Stroke in his mother.      No Known Allergies   Current Outpatient Medications:  .  clonazePAM (KLONOPIN) 1 MG tablet, Take 1 tablet (1 mg total) by mouth daily., Disp: 30 tablet, Rfl: 4 .  diphenhydrAMINE (BENADRYL) 50 MG capsule, Take by mouth., Disp: , Rfl:  .  etodolac (LODINE) 500 MG tablet, Take 500 mg by mouth as needed., Disp: , Rfl:  .  rosuvastatin (CRESTOR) 5 MG tablet, Take 1 tablet (5 mg total) by mouth daily., Disp: 90 tablet, Rfl: 3 .  acetaZOLAMIDE (DIAMOX) 500 MG capsule, TAKE ONE CAPSULE TWICE A DAY (Patient not taking: Reported on 04/06/2017), Disp: 60 capsule, Rfl: 3 .  omeprazole (PRILOSEC) 20 MG capsule, TAKE ONE  CAPSULE DAILY (Patient not taking: Reported on 04/06/2017), Disp: 30 capsule, Rfl: 12   Patient Care Team: Maple HudsonGilbert, Devlin Mcveigh L Jr., MD as PCP - General (Family Medicine)      Objective:   Vitals: BP 112/70 (BP Location: Left Arm, Patient Position: Sitting, Cuff Size: Normal)   Pulse 70   Temp 98 F (36.7 C) (Oral)   Resp 16   Ht 5\' 11"  (1.803 m)   Wt 199 lb (90.3 kg)   BMI 27.75 kg/m    Vitals:   04/06/17 0938  BP: 112/70  Pulse: 70  Resp: 16  Temp: 98 F (36.7 C)  TempSrc: Oral  Weight: 199 lb (90.3 kg)  Height: 5\' 11"  (1.803 m)     Physical Exam  Constitutional: He is oriented to person, place, and time. He appears well-developed and well-nourished.  HENT:  Head: Normocephalic and atraumatic.  Right Ear: External ear normal.  Left Ear: External ear normal.  Nose: Nose normal.  Mouth/Throat: Oropharynx is clear and moist.  Eyes: Conjunctivae are normal. No scleral icterus.  Neck: No thyromegaly present.  Cardiovascular: Normal rate, regular rhythm, normal heart sounds and intact distal pulses.  Pulmonary/Chest: Effort normal and breath sounds normal.  Left nipple chronically inverted.  Abdominal: Soft.  Genitourinary: Rectum normal, prostate normal and penis normal.  Musculoskeletal: Normal range of motion.  Neurological: He is alert and oriented to person, place, and time.  Skin: Skin is warm and dry.  Psychiatric: He has a normal mood and affect. His behavior is normal. Judgment and thought content normal.     Depression Screen PHQ 2/9 Scores 04/06/2017 04/06/2017 03/18/2016 03/18/2015  PHQ - 2 Score 0 0 0 0  PHQ- 9 Score 0 - 3 -      Assessment & Plan:     Routine Health Maintenance and Physical Exam  Exercise Activities and Dietary recommendations Goals    None      Immunization History  Administered Date(s) Administered  . Influenza-Unspecified 11/06/2014  . Tdap 09/07/2007    Health Maintenance  Topic Date Due  . HIV Screening  11/23/1981    . INFLUENZA VACCINE  08/05/2017  . TETANUS/TDAP  09/06/2017  . COLONOSCOPY  12/21/2020     Discussed health benefits of physical activity, and encouraged him to engage in regular exercise appropriate for his age and condition.  GERD Prn Zantac.   --------------------------------------------------------------------  I have done the exam and reviewed the above chart and it is accurate to the best of my knowledge. DentistDragon  technology has been used in this note in any air is in the dictation or  transcription are unintentional.   Megan Mans, MD  [facility]

## 2017-05-20 ENCOUNTER — Other Ambulatory Visit: Payer: Self-pay | Admitting: Family Medicine

## 2017-06-02 ENCOUNTER — Other Ambulatory Visit: Payer: Self-pay | Admitting: Family Medicine

## 2017-06-11 DIAGNOSIS — Z0000 Encounter for general adult medical examination without abnormal findings: Secondary | ICD-10-CM | POA: Diagnosis not present

## 2017-06-11 DIAGNOSIS — Z125 Encounter for screening for malignant neoplasm of prostate: Secondary | ICD-10-CM | POA: Diagnosis not present

## 2017-06-12 LAB — CBC WITH DIFFERENTIAL/PLATELET
Basophils Absolute: 0 10*3/uL (ref 0.0–0.2)
Basos: 0 %
EOS (ABSOLUTE): 0.2 10*3/uL (ref 0.0–0.4)
Eos: 3 %
Hematocrit: 45.3 % (ref 37.5–51.0)
Hemoglobin: 15.4 g/dL (ref 13.0–17.7)
IMMATURE GRANULOCYTES: 0 %
Immature Grans (Abs): 0 10*3/uL (ref 0.0–0.1)
Lymphocytes Absolute: 1.8 10*3/uL (ref 0.7–3.1)
Lymphs: 27 %
MCH: 29.7 pg (ref 26.6–33.0)
MCHC: 34 g/dL (ref 31.5–35.7)
MCV: 87 fL (ref 79–97)
Monocytes Absolute: 0.6 10*3/uL (ref 0.1–0.9)
Monocytes: 10 %
NEUTROS PCT: 60 %
Neutrophils Absolute: 3.9 10*3/uL (ref 1.4–7.0)
PLATELETS: 227 10*3/uL (ref 150–450)
RBC: 5.19 x10E6/uL (ref 4.14–5.80)
RDW: 12.9 % (ref 12.3–15.4)
WBC: 6.6 10*3/uL (ref 3.4–10.8)

## 2017-06-12 LAB — PSA: Prostate Specific Ag, Serum: 1.2 ng/mL (ref 0.0–4.0)

## 2017-06-12 LAB — COMPREHENSIVE METABOLIC PANEL
A/G RATIO: 1.9 (ref 1.2–2.2)
ALBUMIN: 4.4 g/dL (ref 3.5–5.5)
ALK PHOS: 53 IU/L (ref 39–117)
ALT: 30 IU/L (ref 0–44)
AST: 22 IU/L (ref 0–40)
BILIRUBIN TOTAL: 0.6 mg/dL (ref 0.0–1.2)
BUN/Creatinine Ratio: 23 — ABNORMAL HIGH (ref 9–20)
BUN: 21 mg/dL (ref 6–24)
CHLORIDE: 103 mmol/L (ref 96–106)
CO2: 23 mmol/L (ref 20–29)
Calcium: 9.2 mg/dL (ref 8.7–10.2)
Creatinine, Ser: 0.92 mg/dL (ref 0.76–1.27)
GFR calc Af Amer: 112 mL/min/{1.73_m2} (ref >59)
GFR calc non Af Amer: 97 mL/min/{1.73_m2} (ref >59)
GLUCOSE: 94 mg/dL (ref 65–99)
Globulin, Total: 2.3 g/dL (ref 1.5–4.5)
POTASSIUM: 4.7 mmol/L (ref 3.5–5.2)
Sodium: 139 mmol/L (ref 134–144)
Total Protein: 6.7 g/dL (ref 6.0–8.5)

## 2017-06-12 LAB — LIPID PANEL
CHOLESTEROL TOTAL: 131 mg/dL (ref 100–199)
Chol/HDL Ratio: 3.2 ratio (ref 0.0–5.0)
HDL: 41 mg/dL (ref >39)
LDL Calculated: 80 mg/dL (ref 0–99)
TRIGLYCERIDES: 50 mg/dL (ref 0–149)
VLDL Cholesterol Cal: 10 mg/dL (ref 5–40)

## 2017-06-12 LAB — TSH: TSH: 1.92 u[IU]/mL (ref 0.450–4.500)

## 2017-06-14 ENCOUNTER — Telehealth: Payer: Self-pay

## 2017-06-14 NOTE — Progress Notes (Signed)
Advised  ED 

## 2017-06-14 NOTE — Telephone Encounter (Signed)
LMTCB  Have paper ready except that patient needs to have his waist measured when he comes in to pick up paper.   Paper is in [place] nurses' basket.

## 2017-06-14 NOTE — Telephone Encounter (Signed)
Advised  ED 

## 2017-11-09 ENCOUNTER — Telehealth: Payer: Self-pay | Admitting: Family Medicine

## 2017-11-09 DIAGNOSIS — Z1211 Encounter for screening for malignant neoplasm of colon: Secondary | ICD-10-CM

## 2017-11-09 DIAGNOSIS — K219 Gastro-esophageal reflux disease without esophagitis: Secondary | ICD-10-CM

## 2017-11-09 NOTE — Telephone Encounter (Signed)
ok 

## 2017-11-09 NOTE — Telephone Encounter (Signed)
Please review. Thanks!  

## 2017-11-09 NOTE — Telephone Encounter (Signed)
Pt needing a referral to have his colonoscopy done.  Stated Dr. Sullivan Lone said to let him know when he was ready. Pt also has heart burn and wanting a referral for that as well.  States Dr. Sullivan Lone know about this as well.  Please advise.  Thanks, [employer]

## 2017-11-23 DIAGNOSIS — Z1211 Encounter for screening for malignant neoplasm of colon: Secondary | ICD-10-CM | POA: Diagnosis not present

## 2017-11-23 DIAGNOSIS — K219 Gastro-esophageal reflux disease without esophagitis: Secondary | ICD-10-CM | POA: Diagnosis not present

## 2017-12-07 ENCOUNTER — Other Ambulatory Visit: Payer: Self-pay | Admitting: Family Medicine

## 2017-12-07 MED ORDER — ACETAZOLAMIDE ER 500 MG PO CP12: 500.0000 mg | ORAL_CAPSULE | Freq: Two times a day (BID) | ORAL | 3 refills | Status: DC

## 2017-12-07 NOTE — Telephone Encounter (Signed)
[facility] faxed refill request for the following medications:  acetaZOLAMIDE (DIAMOX) 500 MG capsule  Please advise.

## 2017-12-10 DIAGNOSIS — L570 Actinic keratosis: Secondary | ICD-10-CM | POA: Diagnosis not present

## 2017-12-10 DIAGNOSIS — D2262 Melanocytic nevi of left upper limb, including shoulder: Secondary | ICD-10-CM | POA: Diagnosis not present

## 2017-12-10 DIAGNOSIS — D225 Melanocytic nevi of trunk: Secondary | ICD-10-CM | POA: Diagnosis not present

## 2017-12-10 DIAGNOSIS — D2261 Melanocytic nevi of right upper limb, including shoulder: Secondary | ICD-10-CM | POA: Diagnosis not present

## 2017-12-10 DIAGNOSIS — Z85828 Personal history of other malignant neoplasm of skin: Secondary | ICD-10-CM | POA: Diagnosis not present

## 2017-12-10 DIAGNOSIS — X32XXXA Exposure to sunlight, initial encounter: Secondary | ICD-10-CM | POA: Diagnosis not present

## 2017-12-16 DIAGNOSIS — K640 First degree hemorrhoids: Secondary | ICD-10-CM | POA: Diagnosis not present

## 2017-12-16 DIAGNOSIS — K635 Polyp of colon: Secondary | ICD-10-CM | POA: Diagnosis not present

## 2017-12-16 DIAGNOSIS — D125 Benign neoplasm of sigmoid colon: Secondary | ICD-10-CM | POA: Diagnosis not present

## 2017-12-16 DIAGNOSIS — K210 Gastro-esophageal reflux disease with esophagitis: Secondary | ICD-10-CM | POA: Diagnosis not present

## 2017-12-16 DIAGNOSIS — D122 Benign neoplasm of ascending colon: Secondary | ICD-10-CM | POA: Diagnosis not present

## 2017-12-16 DIAGNOSIS — Z1211 Encounter for screening for malignant neoplasm of colon: Secondary | ICD-10-CM | POA: Diagnosis not present

## 2017-12-16 DIAGNOSIS — Z86010 Personal history of colonic polyps: Secondary | ICD-10-CM | POA: Diagnosis not present

## 2017-12-16 LAB — HM COLONOSCOPY

## 2017-12-24 ENCOUNTER — Other Ambulatory Visit: Payer: Self-pay | Admitting: Family Medicine

## 2018-03-29 ENCOUNTER — Other Ambulatory Visit: Payer: Self-pay

## 2018-03-29 NOTE — Telephone Encounter (Signed)
Patient needs refill on medication. He would like a quantity of 90 tablets instead of 60 tablets. He has been under more stress lately with the current situation of COVID -19 affecting insurance and jobs.

## 2018-03-30 MED ORDER — CLONAZEPAM 1 MG PO TABS: 1.0000 mg | ORAL_TABLET | Freq: Every day | ORAL | 1 refills | Status: DC

## 2018-03-30 NOTE — Telephone Encounter (Signed)
Please review

## 2018-04-12 ENCOUNTER — Encounter: Payer: Self-pay | Admitting: Family Medicine

## 2018-05-23 ENCOUNTER — Telehealth: Payer: Self-pay | Admitting: Family Medicine

## 2018-05-23 NOTE — Telephone Encounter (Signed)
Please review. Thanks!  

## 2018-05-23 NOTE — Telephone Encounter (Signed)
Pt needs a new prescription Clonazepam 1 mg  He ran out early because his rx was changed from 2 a day to 3 a day  [facility]  CB#  [phone]  Thanks Barth Kirks

## 2018-05-24 MED ORDER — CLONAZEPAM 1 MG PO TABS
1.0000 mg | ORAL_TABLET | Freq: Three times a day (TID) | ORAL | 0 refills | Status: DC | PRN
Start: 2018-05-24 — End: 2018-06-13

## 2018-05-24 NOTE — Telephone Encounter (Signed)
Patient advised.KW 

## 2018-05-24 NOTE — Telephone Encounter (Signed)
I can't tell if Dr. Sullivan Lone agreed to increase to TID. Have sent enough in to get by until Dr. Sullivan Lone returns.

## 2018-06-07 ENCOUNTER — Other Ambulatory Visit: Payer: Self-pay | Admitting: Family Medicine

## 2018-06-07 NOTE — Telephone Encounter (Signed)
Please review

## 2018-06-07 NOTE — Telephone Encounter (Signed)
Pt needing a refill on:  clonazePAM (KLONOPIN) 1 MG tablet 90 for 1 month.  His Rx was increased recently and is runiing out.  Please fill at:  [facility] - [place], [place] - [address] [phone] (Phone) [phone] (Fax)   Thanks, [facility]

## 2018-06-13 MED ORDER — CLONAZEPAM 1 MG PO TABS: 1.0000 mg | ORAL_TABLET | Freq: Three times a day (TID) | ORAL | 0 refills | Status: DC | PRN

## 2018-06-13 NOTE — Telephone Encounter (Signed)
Pt called to check on his clonazepam 1 mg rx.  He was asking if it has been done yet.  teri

## 2018-06-13 NOTE — Telephone Encounter (Signed)
Please review. Thanks!  

## 2018-07-05 ENCOUNTER — Encounter: Payer: Self-pay | Admitting: Family Medicine

## 2018-07-05 ENCOUNTER — Other Ambulatory Visit: Payer: Self-pay

## 2018-07-05 ENCOUNTER — Ambulatory Visit (INDEPENDENT_AMBULATORY_CARE_PROVIDER_SITE_OTHER): Payer: BC Managed Care – PPO | Admitting: Family Medicine

## 2018-07-05 VITALS — BP 90/64 | HR 71 | Temp 98.5°F | Resp 16 | Ht 71.0 in | Wt 200.8 lb

## 2018-07-05 DIAGNOSIS — K219 Gastro-esophageal reflux disease without esophagitis: Secondary | ICD-10-CM | POA: Diagnosis not present

## 2018-07-05 DIAGNOSIS — Z125 Encounter for screening for malignant neoplasm of prostate: Secondary | ICD-10-CM | POA: Diagnosis not present

## 2018-07-05 DIAGNOSIS — Z0000 Encounter for general adult medical examination without abnormal findings: Secondary | ICD-10-CM

## 2018-07-05 DIAGNOSIS — M25512 Pain in left shoulder: Secondary | ICD-10-CM

## 2018-07-05 DIAGNOSIS — G4700 Insomnia, unspecified: Secondary | ICD-10-CM

## 2018-07-05 DIAGNOSIS — F411 Generalized anxiety disorder: Secondary | ICD-10-CM | POA: Diagnosis not present

## 2018-07-05 DIAGNOSIS — E782 Mixed hyperlipidemia: Secondary | ICD-10-CM | POA: Diagnosis not present

## 2018-07-05 LAB — POCT URINALYSIS DIPSTICK
Bilirubin, UA: NEGATIVE
Blood, UA: NEGATIVE
Glucose, UA: NEGATIVE
Ketones, UA: NEGATIVE
Leukocytes, UA: NEGATIVE
Nitrite, UA: NEGATIVE
Protein, UA: NEGATIVE
Spec Grav, UA: <=1.005 — AB (ref 1.010–1.025)
Urobilinogen, UA: 0.2 E.U./dL
pH, UA: 7 (ref 5.0–8.0)

## 2018-07-05 MED ORDER — CLONAZEPAM 1 MG PO TABS: 1.0000 mg | ORAL_TABLET | Freq: Two times a day (BID) | ORAL | 5 refills | Status: DC | PRN

## 2018-07-05 MED ORDER — ETODOLAC 500 MG PO TABS: 500.0000 mg | ORAL_TABLET | Freq: Two times a day (BID) | ORAL | 1 refills | Status: DC | PRN

## 2018-07-05 MED ORDER — OMEPRAZOLE 40 MG PO CPDR: 40.0000 mg | DELAYED_RELEASE_CAPSULE | Freq: Every day | ORAL | 12 refills | Status: DC

## 2018-07-05 NOTE — Patient Instructions (Signed)
Try Co-Q-10 with Rosuvastatin.

## 2018-07-05 NOTE — Progress Notes (Signed)
Patient: William Thompson, Male    DOB: 12-Jun-1965, 53 y.o.   MRN: [id number] Visit Date: 07/05/2018  Today's Provider: Megan Mansichard  Jr, MD   Chief Complaint  Patient presents with  . Annual Exam   Subjective:    Annual physical exam William Thompson is a 53 y.o. male who presents today for health maintenance and complete physical. He feels well. He reports exercising walking 2-264miles daily. He reports he is sleeping poorly averaging 6hrs of sleep a night and waking up more than 2x a night. Over all doing well but he is anxious about covid pandemic.  ----------------------------------------------------------------- Last Reported Colonoscopy 12/16/2017  Review of Systems  Constitutional: Negative.   Eyes: Negative.   Respiratory: Negative.   Cardiovascular: Negative.   Gastrointestinal: Negative.   Endocrine: Negative.   Genitourinary: Negative.   Musculoskeletal: Positive for arthralgias.       Left shoulder discomfort with movement of shoulder.  Skin: Negative.   Allergic/Immunologic: Negative.   Neurological: Negative.   Hematological: Negative.   Psychiatric/Behavioral: Negative.     Social History He  reports that he has never smoked. He has never used smokeless tobacco. He reports that he does not drink alcohol or use drugs. Social History   Socioeconomic History  . Marital status: Married    Spouse name: Not on file  . Number of children: Not on file  . Years of education: Not on file  . Highest education level: Not on file  Occupational History  . Not on file  Social Needs  . Financial resource strain: Not on file  . Food insecurity    Worry: Not on file    Inability: Not on file  . Transportation needs    Medical: Not on file    Non-medical: Not on file  Tobacco Use  . Smoking status: Never Smoker  . Smokeless tobacco: Never Used  Substance and Sexual Activity  . Alcohol use: No  . Drug use: No  . Sexual activity: Yes    Birth  control/protection: None  Lifestyle  . Physical activity    Days per week: Not on file    Minutes per session: Not on file  . Stress: Not on file  Relationships  . Social Musicianconnections    Talks on phone: Not on file    Gets together: Not on file    Attends religious service: Not on file    Active member of club or organization: Not on file    Attends meetings of clubs or organizations: Not on file    Relationship status: Not on file  Other Topics Concern  . Not on file  Social History Narrative  . Not on file    Patient Active Problem List   Diagnosis Date Noted  . Cardiac risk counseling 03/23/2017  . Family history of early CAD 03/23/2017  . Hyperlipidemia 03/23/2017  . Benign fibroma of prostate 09/06/2014  . Insomnia, persistent 09/06/2014  . Anxiety, generalized 09/06/2014  . Acid reflux 09/06/2014  . Plantar fasciitis 09/06/2014    Past Surgical History:  Procedure Laterality Date  . VASECTOMY      Family History  Family Status  Relation Name Status  . Father  Deceased at age 52  . Brother  Alive  . Mother  Deceased at age 52  . Sister  Alive  . Brother  Alive  . Brother  Alive   His family history includes Arthritis in his father; Heart disease in  his father; Peripheral Artery Disease in his brother; Prostate cancer in his brother and father; Stroke in his mother.     No Known Allergies  Previous Medications   ACETAZOLAMIDE (DIAMOX) 500 MG CAPSULE    Take 1 capsule (500 mg total) by mouth 2 (two) times daily.   CLONAZEPAM (KLONOPIN) 1 MG TABLET    Take 1 tablet (1 mg total) by mouth daily.   DIPHENHYDRAMINE (BENADRYL) 50 MG CAPSULE    Take by mouth.   ETODOLAC (LODINE) 500 MG TABLET    Take 500 mg by mouth as needed.   OMEPRAZOLE (PRILOSEC) 20 MG CAPSULE    TAKE ONE CAPSULE DAILY   ROSUVASTATIN (CRESTOR) 5 MG TABLET    Take 1 tablet (5 mg total) by mouth daily.    Patient Care Team: Jerrol Banana., MD as PCP - General (Family Medicine)       Objective:   Vitals: BP 90/64   Pulse 71   Temp 98.5 F (36.9 C) (Oral)   Resp 16   Ht 5\' 11"  (1.803 m)   Wt 200 lb 12.8 oz (91.1 kg)   BMI 28.01 kg/m    Physical Exam Vitals signs reviewed.  Constitutional:      Appearance: He is well-developed.  HENT:     Head: Normocephalic and atraumatic.     Right Ear: External ear normal.     Left Ear: External ear normal.     Nose: Nose normal.  Eyes:     General: No scleral icterus.    Conjunctiva/sclera: Conjunctivae normal.  Neck:     Thyroid: No thyromegaly.  Cardiovascular:     Rate and Rhythm: Normal rate and regular rhythm.     Heart sounds: Normal heart sounds.  Pulmonary:     Effort: Pulmonary effort is normal.     Breath sounds: Normal breath sounds.  Abdominal:     Palpations: Abdomen is soft.  Genitourinary:    Penis: Normal.      Prostate: Normal.     Rectum: Normal.  Musculoskeletal: Normal range of motion.  Skin:    General: Skin is warm and dry.  Neurological:     Mental Status: He is alert and oriented to person, place, and time.  Psychiatric:        Behavior: Behavior normal.        Thought Content: Thought content normal.        Judgment: Judgment normal.      Depression Screen PHQ 2/9 Scores 07/05/2018 04/06/2017 04/06/2017 03/18/2016  PHQ - 2 Score 1 0 0 0  PHQ- 9 Score 5 0 - 3      Assessment & Plan:     Routine Health Maintenance and Physical Exam  Exercise Activities and Dietary recommendations Goals   None     Immunization History  Administered Date(s) Administered  . Influenza-Unspecified 11/06/2014  . Tdap 09/07/2007    Health Maintenance  Topic Date Due  . HIV Screening  11/23/1981  . TETANUS/TDAP  09/06/2017  . INFLUENZA VACCINE  08/06/2018  . COLONOSCOPY  12/17/2022     Discussed health benefits of physical activity, and encouraged him to engage in regular exercise appropriate for his age and condition.  1. Annual physical exam Good health. - Comprehensive  metabolic panel - CBC with Differential/Platelet - TSH - POCT urinalysis dipstick - SAR CoV2 Serology (COVID 19)AB(IGG)IA  2. Screening for prostate cancer FH of brother,. - PSA  3. Mixed hyperlipidemia  - Lipid panel  4. Insomnia, persistent   5. Anxiety, generalized Try to limit clonazepam.  6. Gastroesophageal reflux disease, esophagitis presence not specified Continue omeprazole for now. 7.Left shoulder pain Try etodolac for 1 month. If not better will stop Crestor and consider trying Co-Q-10. Non cardiac.   -------------------------------------------------------------------- Leo RodI,Kathleen J Wolford,acting as a scribe for Megan Mansichard  Jr, MD.,have documented all relevant documentation on the behalf of Megan MansRichard  Jr, MD,as directed by  Megan Mansichard  Jr, MD while in the presence of Megan Mansichard  Jr, MD.

## 2018-07-06 ENCOUNTER — Telehealth: Payer: Self-pay

## 2018-07-06 LAB — CBC WITH DIFFERENTIAL/PLATELET
Basophils Absolute: 0 10*3/uL (ref 0.0–0.2)
Basos: 1 %
EOS (ABSOLUTE): 0.2 10*3/uL (ref 0.0–0.4)
Eos: 3 %
Hematocrit: 45 % (ref 37.5–51.0)
Hemoglobin: 15.1 g/dL (ref 13.0–17.7)
Immature Grans (Abs): 0 10*3/uL (ref 0.0–0.1)
Immature Granulocytes: 0 %
Lymphocytes Absolute: 1.2 10*3/uL (ref 0.7–3.1)
Lymphs: 24 %
MCH: 28.8 pg (ref 26.6–33.0)
MCHC: 33.6 g/dL (ref 31.5–35.7)
MCV: 86 fL (ref 79–97)
Monocytes Absolute: 0.5 10*3/uL (ref 0.1–0.9)
Monocytes: 10 %
Neutrophils Absolute: 3.2 10*3/uL (ref 1.4–7.0)
Neutrophils: 62 %
Platelets: 229 10*3/uL (ref 150–450)
RBC: 5.25 x10E6/uL (ref 4.14–5.80)
RDW: 12.1 % (ref 11.6–15.4)
WBC: 5.2 10*3/uL (ref 3.4–10.8)

## 2018-07-06 LAB — COMPREHENSIVE METABOLIC PANEL
ALT: 26 IU/L (ref 0–44)
AST: 18 IU/L (ref 0–40)
Albumin/Globulin Ratio: 2.4 — ABNORMAL HIGH (ref 1.2–2.2)
Albumin: 4.5 g/dL (ref 3.8–4.9)
Alkaline Phosphatase: 56 IU/L (ref 39–117)
BUN/Creatinine Ratio: 22 — ABNORMAL HIGH (ref 9–20)
BUN: 20 mg/dL (ref 6–24)
Bilirubin Total: 0.5 mg/dL (ref 0.0–1.2)
CO2: 22 mmol/L (ref 20–29)
Calcium: 9.6 mg/dL (ref 8.7–10.2)
Chloride: 104 mmol/L (ref 96–106)
Creatinine, Ser: 0.93 mg/dL (ref 0.76–1.27)
GFR calc Af Amer: 109 mL/min/{1.73_m2} (ref >59)
GFR calc non Af Amer: 95 mL/min/{1.73_m2} (ref >59)
Globulin, Total: 1.9 g/dL (ref 1.5–4.5)
Glucose: 100 mg/dL — ABNORMAL HIGH (ref 65–99)
Potassium: 4.5 mmol/L (ref 3.5–5.2)
Sodium: 139 mmol/L (ref 134–144)
Total Protein: 6.4 g/dL (ref 6.0–8.5)

## 2018-07-06 LAB — SAR COV2 SEROLOGY (COVID19)AB(IGG),IA: SARS-CoV-2 Ab, IgG: NEGATIVE

## 2018-07-06 LAB — LIPID PANEL
Chol/HDL Ratio: 3.5 ratio (ref 0.0–5.0)
Cholesterol, Total: 148 mg/dL (ref 100–199)
HDL: 42 mg/dL (ref >39)
LDL Calculated: 96 mg/dL (ref 0–99)
Triglycerides: 50 mg/dL (ref 0–149)
VLDL Cholesterol Cal: 10 mg/dL (ref 5–40)

## 2018-07-06 LAB — PSA: Prostate Specific Ag, Serum: 1 ng/mL (ref 0.0–4.0)

## 2018-07-06 LAB — TSH: TSH: 2.02 u[IU]/mL (ref 0.450–4.500)

## 2018-07-06 NOTE — Telephone Encounter (Signed)
Pt advised.  He states he brought in a form to be filled out for his insurance.  Please advise when the is completed.   Thanks,   -Mickel Baas

## 2018-07-06 NOTE — Telephone Encounter (Signed)
-----   Message from Jerrol Banana., MD sent at 07/06/2018 12:02 PM EDT ----- Labs stable--no covid exposure.

## 2018-07-07 NOTE — Telephone Encounter (Signed)
done

## 2018-07-11 NOTE — Telephone Encounter (Signed)
Form left up front for pick up ?

## 2018-08-02 ENCOUNTER — Other Ambulatory Visit: Payer: Self-pay | Admitting: Cardiovascular Disease

## 2018-08-24 ENCOUNTER — Telehealth: Payer: Self-pay

## 2018-08-24 DIAGNOSIS — R079 Chest pain, unspecified: Secondary | ICD-10-CM

## 2018-08-24 NOTE — Telephone Encounter (Signed)
Referral to cardiology secondary to chest pain

## 2018-08-28 NOTE — Progress Notes (Signed)
Cardiology Office Note  Date:  08/29/2018   ID:  William Thompson, DOB 1965-01-22, MRN [id number]  PCP:  Maple HudsonGilbert, Richard L Jr., MD   Chief Complaint  Patient presents with  . New Patient (Initial Visit)    referred by PCP for chest pain. Patient c/o chest discomfort when walking. Meds reviewed verbally with patient.     HPI:  William Thompson is a 53 year old gentleman with past medical history of Anxiety/insomnia Family history of coronary artery disease Presents for follow-up of his coronary calcification seen on CT scan March 2019 and episode of chest pain  Last Tuesday, was walking developed chest pain, fullness in chest Walked back home, layed on bed, symptoms went away  Thursday, similar sx lasting 2 min, went away Second episode presented at rest while he was at work  [employer] mowed on sat, "good workout" Pushed himself harder than his previous walk on the prior Tuesday  He does not think symptoms were GI related  CT coronary calcium score March 2019 Images pulled up and discussed with him, minimal punctate calcification in the LAD Score 54, 83rd percentile  Family history of cardiovascular disease  Father with MI  EKG personally reviewed by myself on todays visit Shows normal sinus rhythm rate 70 bpm no significant ST or T wave changes   PMH:   Anxiety/insomnia GERD  PSH:    Past Surgical History:  Procedure Laterality Date  . VASECTOMY      Current Outpatient Medications  Medication Sig Dispense Refill  . acetaZOLAMIDE (DIAMOX) 500 MG capsule Take 1 capsule (500 mg total) by mouth 2 (two) times daily. 60 capsule 3  . clonazePAM (KLONOPIN) 1 MG tablet Take 1 tablet (1 mg total) by mouth 2 (two) times daily as needed for anxiety. 60 tablet 5  . diphenhydrAMINE (BENADRYL) 50 MG capsule Take by mouth.    . etodolac (LODINE) 500 MG tablet Take 1 tablet (500 mg total) by mouth 2 (two) times daily as needed. 60 tablet 1  . omeprazole (PRILOSEC) 40 MG capsule Take 1  capsule (40 mg total) by mouth daily. 30 capsule 12  . rosuvastatin (CRESTOR) 5 MG tablet TAKE ONE TABLET EVERY DAY 30 tablet 0   No current facility-administered medications for this visit.     Allergies:   Patient has no known allergies.   Social History:  The patient  reports that he has never smoked. He has never used smokeless tobacco. He reports that he does not drink alcohol or use drugs.   Family History:   family history includes Arthritis in his father; Heart disease in his father; Peripheral Artery Disease in his brother; Prostate cancer in his brother and father; Stroke in his mother.    Review of Systems: Review of Systems  Constitutional: Negative.   Respiratory: Negative.   Cardiovascular: Negative.   Gastrointestinal: Negative.   Musculoskeletal: Negative.   Neurological: Negative.   Psychiatric/Behavioral: Negative.   All other systems reviewed and are negative.   PHYSICAL EXAM: VS:  BP 116/84 (BP Location: Left Arm, Patient Position: Sitting, Cuff Size: Normal)   Pulse 70   Ht 5\' 11"  (1.803 m)   Wt 199 lb 12 oz (90.6 kg)   BMI 27.86 kg/m  , BMI Body mass index is 27.86 kg/m. Constitutional:  oriented to person, place, and time. No distress.  HENT:  Head: Grossly normal Eyes:  no discharge. No scleral icterus.  Neck: No JVD, no carotid bruits  Cardiovascular: Regular rate and rhythm, no  murmurs appreciated Pulmonary/Chest: Clear to auscultation bilaterally, no wheezes or rails Abdominal: Soft.  no distension.  no tenderness.  Musculoskeletal: Normal range of motion Neurological:  normal muscle tone. Coordination normal. No atrophy Skin: Skin warm and dry Psychiatric: normal affect, pleasant  Recent Labs: 07/05/2018: ALT 26; BUN 20; Creatinine, Ser 0.93; Hemoglobin 15.1; Platelets 229; Potassium 4.5; Sodium 139; TSH 2.020    Lipid Panel Lab Results  Component Value Date   CHOL 148 07/05/2018   HDL 42 07/05/2018   LDLCALC 96 07/05/2018   TRIG 50  07/05/2018      Wt Readings from Last 3 Encounters:  08/29/18 199 lb 12 oz (90.6 kg)  07/05/18 200 lb 12.8 oz (91.1 kg)  04/06/17 199 lb (90.3 kg)     ASSESSMENT AND PLAN:  Chest pain Atypical and typical features He has exercised hard with mowing this past Saturday with no symptoms One episode seem to present at rest Long discussion with him concerning differential Suggested he also consider GI etiology such as hiatal hernia, esophageal spasm He is less inclined to think it is GI related -Discussed various types of testing including routine treadmill testing, stress Myoview, lastly discussed cardiac CTA -After long discussion he prefers to monitor symptoms for now and if he has recurrent episodes he will call our office, most likely for cardiac CTA which could be done in the outpatient imaging center  Hyperlipidemia On crestor 5 mg daily Might need to add zetia  Having arm pain,   Total encounter time more than 45 minutes  Greater than 50% was spent in counseling and coordination of care with the patient  No orders of the defined types were placed in this encounter.   Signed, Esmond Plants, M.D., Ph.D. 08/29/2018  [place], [place]

## 2018-08-29 ENCOUNTER — Encounter: Payer: Self-pay | Admitting: Cardiovascular Disease

## 2018-08-29 ENCOUNTER — Ambulatory Visit (INDEPENDENT_AMBULATORY_CARE_PROVIDER_SITE_OTHER): Payer: BC Managed Care – PPO | Admitting: Cardiovascular Disease

## 2018-08-29 ENCOUNTER — Other Ambulatory Visit: Payer: Self-pay

## 2018-08-29 VITALS — BP 116/84 | HR 70 | Ht 71.0 in | Wt 199.8 lb

## 2018-08-29 DIAGNOSIS — I2510 Atherosclerotic heart disease of native coronary artery without angina pectoris: Secondary | ICD-10-CM

## 2018-08-29 DIAGNOSIS — E782 Mixed hyperlipidemia: Secondary | ICD-10-CM

## 2018-08-29 MED ORDER — EZETIMIBE 10 MG PO TABS: 10.0000 mg | ORAL_TABLET | Freq: Every day | ORAL | 3 refills | Status: DC

## 2018-08-29 NOTE — Patient Instructions (Addendum)
Medication Instructions:  Your physician has recommended you make the following change in your medication:  1. START Ezetimibe 10 mg once a day  Drop cholesterol 20%  Stay on crestor as tolerated  If you need a refill on your cardiac medications before your next appointment, please call your pharmacy.     Lab work: No new labs needed   If you have labs (blood work) drawn today and your tests are completely normal, you will receive your results only by: Marland Kitchen MyChart Message (if you have MyChart) OR . A paper copy in the mail If you have any lab test that is abnormal or we need to change your treatment, we will call you to review the results.   Testing/Procedures: No new testing needed   Follow-Up: At [facility], you and your health needs are our priority.  As part of our continuing mission to provide you with exceptional heart care, we have created designated Provider Care Teams.  These Care Teams include your primary Cardiologist (physician) and Advanced Practice Providers (APPs -  Physician Assistants and Nurse Practitioners) who all work together to provide you with the care you need, when you need it.  . You will need a follow up appointment as needed  . Providers on your designated Care Team:   . Murray Hodgkins, NP . Christell Faith, PA-C . Marrianne Mood, PA-C  Any Other Special Instructions Will Be Listed Below (If Applicable).  For educational health videos Log in to : [email or web address] Or : [email or web address], password : [email or web address]

## 2018-09-02 ENCOUNTER — Other Ambulatory Visit: Payer: Self-pay | Admitting: Cardiovascular Disease

## 2018-09-21 ENCOUNTER — Ambulatory Visit: Payer: BC Managed Care – PPO

## 2018-11-03 ENCOUNTER — Other Ambulatory Visit: Payer: Self-pay | Admitting: Cardiovascular Disease

## 2019-01-02 ENCOUNTER — Other Ambulatory Visit: Payer: Self-pay | Admitting: Family Medicine

## 2019-01-02 NOTE — Telephone Encounter (Signed)
Requested medication (s) are due for refill today: yes  Requested medication (s) are on the active medication list: yes  Last refill:  12/05/2018  Future visit scheduled: yes  Notes to clinic:  This refill cannot be delegated    Requested Prescriptions  Pending Prescriptions Disp Refills   clonazePAM (KLONOPIN) 1 MG tablet [Pharmacy Med Name: CLONAZEPAM 1 MG TAB] 60 tablet     Sig: TAKE 1 TABLET BY MOUTH TWICE DAILY AS NEEDED FOR ANXIETY      Not Delegated - Psychiatry:  Anxiolytics/Hypnotics Failed - 01/02/2019 12:21 PM      Failed - This refill cannot be delegated      Failed - Urine Drug Screen completed in last 360 days.      Failed - Valid encounter within last 6 months    Recent Outpatient Visits           6 months ago Annual physical exam   [facility] Jerrol Banana., MD   1 year ago Annual physical exam   [facility] Jerrol Banana., MD   2 years ago Annual physical exam   [facility] Jerrol Banana., MD   3 years ago Annual physical exam   [facility] Jerrol Banana., MD   4 years ago Anxiety, generalized   [facility] Jerrol Banana., MD       Future Appointments             In 6 months Jerrol Banana., MD [facility] [place]

## 2019-02-02 ENCOUNTER — Other Ambulatory Visit: Payer: Self-pay | Admitting: Cardiovascular Disease

## 2019-02-08 DIAGNOSIS — Z85828 Personal history of other malignant neoplasm of skin: Secondary | ICD-10-CM | POA: Diagnosis not present

## 2019-02-08 DIAGNOSIS — R208 Other disturbances of skin sensation: Secondary | ICD-10-CM | POA: Diagnosis not present

## 2019-02-08 DIAGNOSIS — C44519 Basal cell carcinoma of skin of other part of trunk: Secondary | ICD-10-CM | POA: Diagnosis not present

## 2019-02-08 DIAGNOSIS — L538 Other specified erythematous conditions: Secondary | ICD-10-CM | POA: Diagnosis not present

## 2019-02-08 DIAGNOSIS — L820 Inflamed seborrheic keratosis: Secondary | ICD-10-CM | POA: Diagnosis not present

## 2019-02-08 DIAGNOSIS — D225 Melanocytic nevi of trunk: Secondary | ICD-10-CM | POA: Diagnosis not present

## 2019-02-08 DIAGNOSIS — D2261 Melanocytic nevi of right upper limb, including shoulder: Secondary | ICD-10-CM | POA: Diagnosis not present

## 2019-02-08 DIAGNOSIS — D2262 Melanocytic nevi of left upper limb, including shoulder: Secondary | ICD-10-CM | POA: Diagnosis not present

## 2019-02-08 DIAGNOSIS — D485 Neoplasm of uncertain behavior of skin: Secondary | ICD-10-CM | POA: Diagnosis not present

## 2019-03-28 DIAGNOSIS — C44519 Basal cell carcinoma of skin of other part of trunk: Secondary | ICD-10-CM | POA: Diagnosis not present

## 2019-05-04 ENCOUNTER — Other Ambulatory Visit: Payer: Self-pay | Admitting: Family Medicine

## 2019-07-03 ENCOUNTER — Other Ambulatory Visit: Payer: Self-pay | Admitting: Cardiovascular Disease

## 2019-07-03 ENCOUNTER — Other Ambulatory Visit: Payer: Self-pay | Admitting: Family Medicine

## 2019-07-03 NOTE — Telephone Encounter (Signed)
Requested medication (s) are due for refill today: yes  Requested medication (s) are on the active medication list: yes  Last refill:  06/06/19  Future visit scheduled: yes  Notes to clinic:  not delegated    Requested Prescriptions  Pending Prescriptions Disp Refills   clonazePAM (KLONOPIN) 1 MG tablet [Pharmacy Med Name: CLONAZEPAM 1 MG TAB] 60 tablet     Sig: TAKE ONE TABLET TWICE A DAY IF NEEDED FOR ANXIETY      Not Delegated - Psychiatry:  Anxiolytics/Hypnotics Failed - 07/03/2019 11:19 AM      Failed - This refill cannot be delegated      Failed - Urine Drug Screen completed in last 360 days.      Failed - Valid encounter within last 6 months    Recent Outpatient Visits           12 months ago Annual physical exam   [facility] Maple Hudson., MD   2 years ago Annual physical exam   [facility] Maple Hudson., MD   3 years ago Annual physical exam   [facility] Maple Hudson., MD   4 years ago Annual physical exam   [facility] Maple Hudson., MD   4 years ago Anxiety, generalized   [facility] Maple Hudson., MD       Future Appointments             In 1 week Maple Hudson., MD [facility]

## 2019-07-03 NOTE — Telephone Encounter (Signed)
30 day courtesy refill provided. Last office visit 07/05/18.  Next OV 07/12/19.

## 2019-07-06 ENCOUNTER — Encounter: Payer: BC Managed Care – PPO | Admitting: Family Medicine

## 2019-07-11 NOTE — Progress Notes (Signed)
Complete physical exam   Patient: William Thompson   DOB: [birth year]   54 y.o. Male  MRN: [id number] Visit Date: 07/12/2019  Today's healthcare provider: Megan Mans, MD   Chief Complaint  Patient presents with  . Annual Exam   Subjective    William Thompson is a 54 y.o. male who presents today for a complete physical exam.  He reports consuming a general and low fat diet. Home exercise routine includes biking (pelaton). He generally feels well. He reports sleeping fairly well. He does not have additional problems to discuss today.  Overall he is doing well.  He is married and his daughter is at [employer].  His son will be a Holiday representative at [employer] and plans to go to [employer].  He has lost 32 pounds total since last year and 18 pounds since his last physical year ago.  He is not snoring since his weight loss per his wife.  He feels great. HPI   Last colonoscopy: 12/16/2017  No past medical history on file. Past Surgical History:  Procedure Laterality Date  . VASECTOMY     Social History   Socioeconomic History  . Marital status: Married    Spouse name: Not on file  . Number of children: Not on file  . Years of education: Not on file  . Highest education level: Not on file  Occupational History  . Not on file  Tobacco Use  . Smoking status: Never Smoker  . Smokeless tobacco: Never Used  Vaping Use  . Vaping Use: Never used  Substance and Sexual Activity  . Alcohol use: No  . Drug use: No  . Sexual activity: Yes    Birth control/protection: None  Other Topics Concern  . Not on file  Social History Narrative  . Not on file   Social Determinants of Health   Financial Resource Strain:   . Difficulty of Paying Living Expenses:   Food Insecurity:   . Worried About Programme researcher, broadcasting/film/video in the Last Year:   . Barista in the Last Year:   Transportation Needs:   . Freight forwarder (Medical):   Marland Kitchen Lack of Transportation (Non-Medical):   Physical  Activity:   . Days of Exercise per Week:   . Minutes of Exercise per Session:   Stress:   . Feeling of Stress :   Social Connections:   . Frequency of Communication with Friends and Family:   . Frequency of Social Gatherings with Friends and Family:   . Attends Religious Services:   . Active Member of Clubs or Organizations:   . Attends Banker Meetings:   Marland Kitchen Marital Status:   Intimate Partner Violence:   . Fear of Current or Ex-Partner:   . Emotionally Abused:   Marland Kitchen Physically Abused:   . Sexually Abused:    Family Status  Relation Name Status  . Father  Deceased at age 75  . Brother  Alive  . Mother  Deceased at age 69  . Sister  Alive  . Brother  Alive  . Brother  Alive   Family History  Problem Relation Age of Onset  . Heart disease Father   . Arthritis Father   . Prostate cancer Father   . Prostate cancer Brother   . Peripheral Artery Disease Brother   . Stroke Mother    No Known Allergies  Patient Care Team: Maple Hudson., MD as PCP -  General (Family Medicine)   Medications: Outpatient Medications Prior to Visit  Medication Sig  . acetaZOLAMIDE (DIAMOX) 500 MG capsule Take 1 capsule (500 mg total) by mouth 2 (two) times daily.  . clonazePAM (KLONOPIN) 1 MG tablet TAKE ONE TABLET TWICE A DAY IF NEEDED FOR ANXIETY  . omeprazole (PRILOSEC) 40 MG capsule TAKE 1 CAPSULE EVERY DAY  . rosuvastatin (CRESTOR) 5 MG tablet Take 1 tablet (5 mg total) by mouth daily. Please schedule appointment for further refills. Thank you!  . diphenhydrAMINE (BENADRYL) 50 MG capsule Take by mouth. (Patient not taking: Reported on 07/12/2019)  . etodolac (LODINE) 500 MG tablet Take 1 tablet (500 mg total) by mouth 2 (two) times daily as needed. (Patient not taking: Reported on 07/12/2019)  . ezetimibe (ZETIA) 10 MG tablet Take 1 tablet (10 mg total) by mouth daily.   No facility-administered medications prior to visit.    Review of Systems  Constitutional: Negative.     HENT: Negative.   Eyes: Negative.   Respiratory: Negative.   Cardiovascular: Negative.   Gastrointestinal: Negative.   Endocrine: Negative.   Genitourinary: Negative.   Musculoskeletal: Negative.   Skin: Negative.   Allergic/Immunologic: Negative.   Neurological: Negative.   Hematological: Negative.   Psychiatric/Behavioral: Negative.        Objective    BP 113/73 (BP Location: Left Arm, Patient Position: Sitting, Cuff Size: Normal)   Pulse 60   Temp (!) 97.1 F (36.2 C) (Temporal)   Ht 5\' 11"  (1.803 m)   Wt 182 lb 9.6 oz (82.8 kg)   BMI 25.47 kg/m  Wt Readings from Last 3 Encounters:  07/12/19 182 lb 9.6 oz (82.8 kg)  08/29/18 199 lb 12 oz (90.6 kg)  07/05/18 200 lb 12.8 oz (91.1 kg)      Physical Exam Vitals reviewed.  Constitutional:      Appearance: He is well-developed.  HENT:     Head: Normocephalic and atraumatic.     Right Ear: External ear normal.     Left Ear: External ear normal.     Nose: Nose normal.  Eyes:     General: No scleral icterus.    Conjunctiva/sclera: Conjunctivae normal.  Neck:     Thyroid: No thyromegaly.  Cardiovascular:     Rate and Rhythm: Normal rate and regular rhythm.     Heart sounds: Normal heart sounds.  Pulmonary:     Effort: Pulmonary effort is normal.     Breath sounds: Normal breath sounds.  Abdominal:     Palpations: Abdomen is soft.  Genitourinary:    Penis: Normal.      Testes: Normal.     Prostate: Normal.     Rectum: Normal. Guaiac result negative.  Musculoskeletal:        General: Normal range of motion.  Skin:    General: Skin is warm and dry.  Neurological:     General: No focal deficit present.     Mental Status: He is alert and oriented to person, place, and time.  Psychiatric:        Mood and Affect: Mood normal.        Behavior: Behavior normal.        Thought Content: Thought content normal.        Judgment: Judgment normal.       Last depression screening scores PHQ 2/9 Scores 07/05/2018  04/06/2017 04/06/2017  PHQ - 2 Score 1 0 0  PHQ- 9 Score 5 0 -   Last fall  risk screening Fall Risk  07/05/2018  Falls in the past year? 0   Last Audit-C alcohol use screening Alcohol Use Disorder Test (AUDIT) 07/05/2018  1. How often do you have a drink containing alcohol? 0   A score of 3 or more in women, and 4 or more in men indicates increased risk for alcohol abuse, EXCEPT if all of the points are from question 1   No results found for any visits on 07/12/19.  Assessment & Plan    Routine Health Maintenance and Physical Exam  Exercise Activities and Dietary recommendations Goals   None     Immunization History  Administered Date(s) Administered  . Influenza Inj Mdck Quad Pf 10/26/2016, 10/07/2017  . Influenza-Unspecified 11/06/2014, 10/05/2016  . Tdap 09/07/2007    Health Maintenance  Topic Date Due  . Hepatitis C Screening  Never done  . COVID-19 Vaccine (1) Never done  . HIV Screening  Never done  . TETANUS/TDAP  09/06/2017  . INFLUENZA VACCINE  08/06/2019  . COLONOSCOPY  12/17/2022    Discussed health benefits of physical activity, and encouraged him to engage in regular exercise appropriate for his age and condition. 1. Annual physical exam Follow-up 1 year. - CBC with Differential/Platelet - TSH - Lipid panel - Comprehensive metabolic panel  2. Mixed hyperlipidemia  - CBC with Differential/Platelet - TSH - Lipid panel - Comprehensive metabolic panel  3. Anxiety, generalized  - CBC with Differential/Platelet - TSH - Lipid panel - Comprehensive metabolic panel  4. Screening for prostate cancer Brother had prostate cancer.  Yearly PSA. - PSA  5. Screening for blood or protein in urine  - POCT urinalysis dipstick  6. Encounter for screening fecal occult blood testing Colonoscopy December 2019 - IFOBT POC (occult bld, rslt in office); Future - IFOBT POC (occult bld, rslt in office)    No follow-ups on file.     I, Megan Mans,  MD, have reviewed all documentation for this visit. The documentation on 07/13/19 for the exam, diagnosis, procedures, and orders are all accurate and complete.    Pantelis Elgersma Wendelyn Breslow, MD  [facility] [phone] (phone) [phone] (fax)  [facility]

## 2019-07-12 ENCOUNTER — Ambulatory Visit (INDEPENDENT_AMBULATORY_CARE_PROVIDER_SITE_OTHER): Payer: BC Managed Care – PPO | Admitting: Family Medicine

## 2019-07-12 ENCOUNTER — Other Ambulatory Visit: Payer: Self-pay

## 2019-07-12 ENCOUNTER — Encounter: Payer: Self-pay | Admitting: Family Medicine

## 2019-07-12 VITALS — BP 113/73 | HR 60 | Temp 97.1°F | Ht 71.0 in | Wt 182.6 lb

## 2019-07-12 DIAGNOSIS — Z1211 Encounter for screening for malignant neoplasm of colon: Secondary | ICD-10-CM | POA: Diagnosis not present

## 2019-07-12 DIAGNOSIS — Z1389 Encounter for screening for other disorder: Secondary | ICD-10-CM

## 2019-07-12 DIAGNOSIS — E782 Mixed hyperlipidemia: Secondary | ICD-10-CM | POA: Diagnosis not present

## 2019-07-12 DIAGNOSIS — Z125 Encounter for screening for malignant neoplasm of prostate: Secondary | ICD-10-CM

## 2019-07-12 DIAGNOSIS — F411 Generalized anxiety disorder: Secondary | ICD-10-CM | POA: Diagnosis not present

## 2019-07-12 DIAGNOSIS — Z0000 Encounter for general adult medical examination without abnormal findings: Secondary | ICD-10-CM | POA: Diagnosis not present

## 2019-07-12 LAB — POCT URINALYSIS DIPSTICK
Bilirubin, UA: NEGATIVE
Blood, UA: NEGATIVE
Glucose, UA: NEGATIVE
Ketones, UA: NEGATIVE
Leukocytes, UA: NEGATIVE
Nitrite, UA: NEGATIVE
Protein, UA: NEGATIVE
Spec Grav, UA: 1.01 (ref 1.010–1.025)
Urobilinogen, UA: 0.2 E.U./dL
pH, UA: 6 (ref 5.0–8.0)

## 2019-07-12 LAB — IFOBT (OCCULT BLOOD): IFOBT: NEGATIVE

## 2019-07-13 LAB — LIPID PANEL
Chol/HDL Ratio: 2.3 ratio (ref 0.0–5.0)
Cholesterol, Total: 118 mg/dL (ref 100–199)
HDL: 51 mg/dL (ref >39)
LDL Chol Calc (NIH): 56 mg/dL (ref 0–99)
Triglycerides: 42 mg/dL (ref 0–149)
VLDL Cholesterol Cal: 11 mg/dL (ref 5–40)

## 2019-07-13 LAB — COMPREHENSIVE METABOLIC PANEL
ALT: 23 IU/L (ref 0–44)
AST: 23 IU/L (ref 0–40)
Albumin/Globulin Ratio: 2.3 — ABNORMAL HIGH (ref 1.2–2.2)
Albumin: 4.8 g/dL (ref 3.8–4.9)
Alkaline Phosphatase: 57 IU/L (ref 48–121)
BUN/Creatinine Ratio: 18 (ref 9–20)
BUN: 17 mg/dL (ref 6–24)
Bilirubin Total: 1.1 mg/dL (ref 0.0–1.2)
CO2: 22 mmol/L (ref 20–29)
Calcium: 9.5 mg/dL (ref 8.7–10.2)
Chloride: 101 mmol/L (ref 96–106)
Creatinine, Ser: 0.94 mg/dL (ref 0.76–1.27)
GFR calc Af Amer: 107 mL/min/{1.73_m2} (ref >59)
GFR calc non Af Amer: 93 mL/min/{1.73_m2} (ref >59)
Globulin, Total: 2.1 g/dL (ref 1.5–4.5)
Glucose: 83 mg/dL (ref 65–99)
Potassium: 4 mmol/L (ref 3.5–5.2)
Sodium: 138 mmol/L (ref 134–144)
Total Protein: 6.9 g/dL (ref 6.0–8.5)

## 2019-07-13 LAB — CBC WITH DIFFERENTIAL/PLATELET
Basophils Absolute: 0 10*3/uL (ref 0.0–0.2)
Basos: 1 %
EOS (ABSOLUTE): 0.1 10*3/uL (ref 0.0–0.4)
Eos: 2 %
Hematocrit: 45.9 % (ref 37.5–51.0)
Hemoglobin: 15.6 g/dL (ref 13.0–17.7)
Immature Grans (Abs): 0 10*3/uL (ref 0.0–0.1)
Immature Granulocytes: 0 %
Lymphocytes Absolute: 1.6 10*3/uL (ref 0.7–3.1)
Lymphs: 21 %
MCH: 30.1 pg (ref 26.6–33.0)
MCHC: 34 g/dL (ref 31.5–35.7)
MCV: 88 fL (ref 79–97)
Monocytes Absolute: 0.6 10*3/uL (ref 0.1–0.9)
Monocytes: 8 %
Neutrophils Absolute: 5.1 10*3/uL (ref 1.4–7.0)
Neutrophils: 68 %
Platelets: 224 10*3/uL (ref 150–450)
RBC: 5.19 x10E6/uL (ref 4.14–5.80)
RDW: 12 % (ref 11.6–15.4)
WBC: 7.5 10*3/uL (ref 3.4–10.8)

## 2019-07-13 LAB — PSA: Prostate Specific Ag, Serum: 1.1 ng/mL (ref 0.0–4.0)

## 2019-07-13 LAB — TSH: TSH: 1.28 u[IU]/mL (ref 0.450–4.500)

## 2019-07-18 ENCOUNTER — Telehealth: Payer: Self-pay

## 2019-07-18 NOTE — Telephone Encounter (Signed)
Patient has viewed results in MyChart on 07/18/19.

## 2019-07-18 NOTE — Telephone Encounter (Signed)
Pt given lab results per notes of Dr Sullivan Lone on 07/14/19. Pt verbalized understanding.

## 2019-07-18 NOTE — Telephone Encounter (Signed)
-----   Message from Maple Hudson., MD sent at 07/14/2019  9:02 AM EDT ----- Labs all good.

## 2019-07-18 NOTE — Telephone Encounter (Signed)
LMTCB, [facility] Triage Nurse may give patient results  

## 2019-07-26 ENCOUNTER — Other Ambulatory Visit: Payer: Self-pay | Admitting: Cardiovascular Disease

## 2019-07-26 ENCOUNTER — Other Ambulatory Visit: Payer: Self-pay | Admitting: Family Medicine

## 2019-08-28 ENCOUNTER — Other Ambulatory Visit: Payer: Self-pay | Admitting: Family Medicine

## 2019-08-28 NOTE — Telephone Encounter (Signed)
Requested medication (s) are due for refill today: yes  Requested medication (s) are on the active medication list: yes  Last refill:  07/03/19 #60  Future visit scheduled: yes  Notes to clinic:  Please review for refill. Refill not delegated per protocol    Requested Prescriptions  Pending Prescriptions Disp Refills   clonazePAM (KLONOPIN) 1 MG tablet [Pharmacy Med Name: CLONAZEPAM 1 MG TAB] 60 tablet     Sig: TAKE ONE TABLET TWICE A DAY IF NEEDED FOR ANXIETY      Not Delegated - Psychiatry:  Anxiolytics/Hypnotics Failed - 08/28/2019 11:14 AM      Failed - This refill cannot be delegated      Failed - Urine Drug Screen completed in last 360 days.      Passed - Valid encounter within last 6 months    Recent Outpatient Visits           1 month ago Annual physical exam   [facility] Maple Hudson., MD   1 year ago Annual physical exam   [facility] Maple Hudson., MD   2 years ago Annual physical exam   [facility] Maple Hudson., MD   3 years ago Annual physical exam   [facility] Maple Hudson., MD   4 years ago Annual physical exam   [facility] Maple Hudson., MD       Future Appointments             In 10 months Maple Hudson., MD [facility]

## 2019-08-30 DIAGNOSIS — Z85828 Personal history of other malignant neoplasm of skin: Secondary | ICD-10-CM | POA: Diagnosis not present

## 2019-08-30 DIAGNOSIS — D2262 Melanocytic nevi of left upper limb, including shoulder: Secondary | ICD-10-CM | POA: Diagnosis not present

## 2019-08-30 DIAGNOSIS — D2261 Melanocytic nevi of right upper limb, including shoulder: Secondary | ICD-10-CM | POA: Diagnosis not present

## 2019-08-30 DIAGNOSIS — D225 Melanocytic nevi of trunk: Secondary | ICD-10-CM | POA: Diagnosis not present

## 2019-09-22 ENCOUNTER — Other Ambulatory Visit: Payer: Self-pay | Admitting: Cardiovascular Disease

## 2019-09-22 NOTE — Telephone Encounter (Signed)
Attempted to schedule.  LMOV to call office.  ° °

## 2019-09-22 NOTE — Telephone Encounter (Signed)
Pt OD for 12 month f/u. Please contact pt for future appointment. 

## 2019-09-26 NOTE — Telephone Encounter (Signed)
scheduled for virtual visit 9/22

## 2019-09-27 ENCOUNTER — Encounter: Payer: Self-pay | Admitting: Cardiovascular Disease

## 2019-09-27 ENCOUNTER — Telehealth (INDEPENDENT_AMBULATORY_CARE_PROVIDER_SITE_OTHER): Payer: BC Managed Care – PPO | Admitting: Cardiovascular Disease

## 2019-09-27 ENCOUNTER — Other Ambulatory Visit: Payer: Self-pay

## 2019-09-27 VITALS — Ht 71.0 in | Wt 178.0 lb

## 2019-09-27 DIAGNOSIS — Z8249 Family history of ischemic heart disease and other diseases of the circulatory system: Secondary | ICD-10-CM | POA: Diagnosis not present

## 2019-09-27 DIAGNOSIS — I2510 Atherosclerotic heart disease of native coronary artery without angina pectoris: Secondary | ICD-10-CM | POA: Diagnosis not present

## 2019-09-27 DIAGNOSIS — E782 Mixed hyperlipidemia: Secondary | ICD-10-CM | POA: Diagnosis not present

## 2019-09-27 MED ORDER — ROSUVASTATIN CALCIUM 5 MG PO TABS: 5.0000 mg | ORAL_TABLET | Freq: Every day | ORAL | 4 refills | Status: DC

## 2019-09-27 MED ORDER — EZETIMIBE 10 MG PO TABS: 10.0000 mg | ORAL_TABLET | Freq: Every day | ORAL | 4 refills | Status: DC

## 2019-09-27 NOTE — Patient Instructions (Signed)
Medication Instructions:  No changes  If you need a refill on your cardiac medications before your next appointment, please call your pharmacy.    Lab work: No new labs needed   If you have labs (blood work) drawn today and your tests are completely normal, you will receive your results only by: . MyChart Message (if you have MyChart) OR . A paper copy in the mail If you have any lab test that is abnormal or we need to change your treatment, we will call you to review the results.   Testing/Procedures: No new testing needed   Follow-Up: At [facility], you and your health needs are our priority.  As part of our continuing mission to provide you with exceptional heart care, we have created designated Provider Care Teams.  These Care Teams include your primary Cardiologist (physician) and Advanced Practice Providers (APPs -  Physician Assistants and Nurse Practitioners) who all work together to provide you with the care you need, when you need it.  . You will need a follow up appointment in 12 months  . Providers on your designated Care Team:   . Christopher Berge, NP . Ryan Dunn, PA-C . Jacquelyn Visser, PA-C  Any Other Special Instructions Will Be Listed Below (If Applicable).  COVID-19 Vaccine Information can be found at: [email or web address] For questions related to vaccine distribution or appointments, please email [email or web address] or call [phone].     

## 2019-09-27 NOTE — Progress Notes (Signed)
Virtual Visit via Telephone Note   This visit type was conducted due to national recommendations for restrictions regarding the COVID-19 Pandemic (e.g. social distancing) in an effort to limit this patient's exposure and mitigate transmission in our community.  Due to his co-morbid illnesses, this patient is at least at moderate risk for complications without adequate follow up.  This format is felt to be most appropriate for this patient at this time.  The patient did not have access to video technology/had technical difficulties with video requiring transitioning to audio format only (telephone).  All issues noted in this document were discussed and addressed.  No physical exam could be performed with this format.  Please refer to the patient's chart for his  consent to telehealth for [facility].   I connected with  William Thompson on 09/27/19 by a video enabled telemedicine application and verified that I am speaking with the correct person using two identifiers. I am contacting the patient above from our cardiology clinic office or alternate office work station to their home, I discussed the limitations of evaluation and management by telemedicine. The patient expressed understanding and agreed to proceed.   Evaluation Performed:  Follow-up visit  Date:  09/27/2019   ID:  William Thompson, DOB [birth year], MRN [id number]  Patient Location:  [address] [place] [address]   Provider location:   William Thompson, [place] office  PCP:  William Thompson., MD  Cardiologist:  William Thompson [facility]   Chief Complaint  Patient presents with   other    OD 12 month f/u no complaints today. Meds reviewed verbally with pt.    History of Present Illness:    William Thompson is a 54 y.o. male who presents via audio/video conferencing for a telehealth visit today.   The patient does not symptoms concerning for COVID-19 infection (fever, chills, cough, or new SHORTNESS OF  BREATH).   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 Now 175-180 pounds Walks the beach, uses [place] 1 hr a day  Rare chest pain getting on a plane, July 2021 None since then  Lab Results  Component Value Date   CHOL 118 07/12/2019   HDL 51 07/12/2019   LDLCALC 56 07/12/2019   TRIG 42 07/12/2019   On zetia, crestor 5 No side effects  Other past medical hx CT coronary calcium score March 2019 Images pulled up and discussed with him, minimal punctate calcification in the LAD Score 54, 83rd percentile  Family history of cardiovascular disease  Father with MI  Prior CV studies:   The following studies were reviewed today:   History reviewed. No pertinent past medical history. Past Surgical History:  Procedure Laterality Date   VASECTOMY       Allergies:   Patient has no known allergies.   Social History   Tobacco Use   Smoking status: Never Smoker   Smokeless tobacco: Never Used  Vaping Use   Vaping Use: Never used  Substance Use Topics   Alcohol use: No   Drug use: No     Current Outpatient Medications on File Prior to Visit  Medication Sig Dispense Refill   acetaZOLAMIDE (DIAMOX) 500 MG capsule Take  1 capsule (500 mg total) by mouth 2 (two) times daily. 60 capsule 3   clonazePAM (KLONOPIN) 1 MG tablet TAKE ONE TABLET TWICE A DAY IF NEEDED FOR ANXIETY 60 tablet 4   diphenhydrAMINE (BENADRYL) 50 MG capsule Take by mouth.      omeprazole (PRILOSEC) 40 MG capsule TAKE 1 CAPSULE EVERY DAY 90 capsule 3   No current facility-administered medications on file prior to visit.     Family Hx: The patient's family history includes Arthritis in his father; Heart disease in his father;  Peripheral Artery Disease in his brother; Prostate cancer in his brother and father; Stroke in his mother.  ROS:   Please see the history of present illness.    Review of Systems  Constitutional: Negative.   HENT: Negative.   Respiratory: Negative.   Cardiovascular: Negative.   Gastrointestinal: Negative.   Musculoskeletal: Negative.   Neurological: Negative.   Psychiatric/Behavioral: Negative.   All other systems reviewed and are negative.    Labs/Other Tests and Data Reviewed:    Recent Labs: 07/12/2019: ALT 23; BUN 17; Creatinine, Ser 0.94; Hemoglobin 15.6; Platelets 224; Potassium 4.0; Sodium 138; TSH 1.280   Recent Lipid Panel Lab Results  Component Value Date/Time   CHOL 118 07/12/2019 03:53 PM   TRIG 42 07/12/2019 03:53 PM   HDL 51 07/12/2019 03:53 PM   CHOLHDL 2.3 07/12/2019 03:53 PM   LDLCALC 56 07/12/2019 03:53 PM    Wt Readings from Last 3 Encounters:  09/27/19 178 lb (80.7 kg)  07/12/19 182 lb 9.6 oz (82.8 kg)  08/29/18 199 lb 12 oz (90.6 kg)     Exam:    Vital Signs: Vital signs may also be detailed in the HPI Ht 5\' 11"  (1.803 m)    Wt 178 lb (80.7 kg)    BMI 24.83 kg/m   Wt Readings from Last 3 Encounters:  09/27/19 178 lb (80.7 kg)  07/12/19 182 lb 9.6 oz (82.8 kg)  08/29/18 199 lb 12 oz (90.6 kg)   Temp Readings from Last 3 Encounters:  07/12/19 (!) 97.1 F (36.2 C) (Temporal)  07/05/18 98.5 F (36.9 C) (Oral)  04/06/17 98 F (36.7 C) (Oral)   BP Readings from Last 3 Encounters:  07/12/19 113/73  08/29/18 116/84  07/05/18 90/64   Pulse Readings from Last 3 Encounters:  07/12/19 60  08/29/18 70  07/05/18 71     Constitutional:  oriented to person, place, and time. No distress.    ASSESSMENT & PLAN:    Problem List Items Addressed This Visit      Cardiology Problems   Hyperlipidemia - Primary   Relevant Medications   rosuvastatin (CRESTOR) 5 MG tablet   ezetimibe (ZETIA) 10 MG tablet     Other   Family history of early CAD     Other Visit Diagnoses    Coronary artery disease involving native coronary artery of native heart without angina pectoris       Relevant Medications   rosuvastatin (CRESTOR) 5 MG tablet   ezetimibe (ZETIA) 10 MG tablet     Coronary calcification Low calcium score several years ago 1 episode rare chest pain over the summer, was not in the setting of exertion No further episodes, exercising on a regular basis with good exercise tolerance No further testing needed at this time  Hyperlipidemia Well-controlled, numbers the best he has ever had Medications refilled    COVID-19 Education: The signs and symptoms of COVID-19 were discussed with the patient and how  to seek care for testing (follow up with PCP or arrange E-visit).  The importance of social distancing was discussed today.  Patient Risk:   After full review of this patients clinical status, I feel that they are at least moderate risk at this time.  Time:   Today, I have spent 25 minutes with the patient with telehealth technology discussing the cardiac and medical problems/diagnoses detailed above   Additional 10 min spent reviewing the chart prior to patient visit today   Medication Adjustments/Labs and Tests Ordered: Current medicines are reviewed at length with the patient today.  Concerns regarding medicines are outlined above.   Tests Ordered: No tests ordered   Medication Changes: No changes made    Signed, Julien Nordmann, MD  [facility] [address], [place], [place] [address]

## 2019-10-13 ENCOUNTER — Telehealth: Payer: Self-pay | Admitting: *Deleted

## 2019-10-13 NOTE — Telephone Encounter (Signed)
Please advise 

## 2019-10-13 NOTE — Telephone Encounter (Signed)
Copied from CRM [id number]. Topic: General - Other >> Oct 12, 2019  3:58 PM Gwenlyn Fudge wrote: Reason for CRM: Pt called and is requesting to have a prescription for oxygen mask/ tank. He states that he is going on a trip from 12/28/19-01[place]03[place]20 and that he will be at a high altitude and is in need of the oxygen. Pt is requesting  to have it faxed to [facility].   Fax #: [phone]

## 2019-10-16 NOTE — Telephone Encounter (Signed)
Patient thinks he may have a hernia in addition to the above request.  See if we can make an appointment for him on the 13th, Wednesday and either 11 or 240? thx

## 2019-10-16 NOTE — Telephone Encounter (Signed)
Appointment has already been scheduled

## 2019-10-18 ENCOUNTER — Other Ambulatory Visit: Payer: Self-pay

## 2019-10-18 ENCOUNTER — Ambulatory Visit (INDEPENDENT_AMBULATORY_CARE_PROVIDER_SITE_OTHER): Payer: BC Managed Care – PPO | Admitting: Family Medicine

## 2019-10-18 ENCOUNTER — Encounter: Payer: Self-pay | Admitting: Family Medicine

## 2019-10-18 VITALS — BP 102/75 | HR 69 | Temp 98.0°F | Resp 16 | Wt 181.0 lb

## 2019-10-18 DIAGNOSIS — G4700 Insomnia, unspecified: Secondary | ICD-10-CM

## 2019-10-18 DIAGNOSIS — K439 Ventral hernia without obstruction or gangrene: Secondary | ICD-10-CM

## 2019-10-18 DIAGNOSIS — F411 Generalized anxiety disorder: Secondary | ICD-10-CM | POA: Diagnosis not present

## 2019-10-18 NOTE — Progress Notes (Signed)
I,Maanasa Aderhold,acting as a scribe for Megan Mans, MD.,have documented all relevant documentation on the behalf of Megan Mans, MD,as directed by  Megan Mans, MD while in the presence of Megan Mans, MD.   Established patient visit   Patient: William Thompson   DOB: October 08, 1965   54 y.o. Male  MRN: [id number] Visit Date: 10/18/2019  Today's healthcare provider: Megan Mans, MD   No chief complaint on file.  Subjective    HPI  Patient is here concerning a possible abdominal hernia. Patient states he noticed a bulge above his naval 6 months ago. Patient states he noticed the bulge when he was lifting weights. Patient states he pushed the bulge back in and it went away. Patient states there is no pain.       Medications: Outpatient Medications Prior to Visit  Medication Sig  . acetaZOLAMIDE (DIAMOX) 500 MG capsule Take 1 capsule (500 mg total) by mouth 2 (two) times daily.  . clonazePAM (KLONOPIN) 1 MG tablet TAKE ONE TABLET TWICE A DAY IF NEEDED FOR ANXIETY  . diphenhydrAMINE (BENADRYL) 50 MG capsule Take by mouth.   . ezetimibe (ZETIA) 10 MG tablet Take 1 tablet (10 mg total) by mouth daily.  Marland Kitchen omeprazole (PRILOSEC) 40 MG capsule TAKE 1 CAPSULE EVERY DAY  . rosuvastatin (CRESTOR) 5 MG tablet Take 1 tablet (5 mg total) by mouth daily.   No facility-administered medications prior to visit.    Review of Systems  Constitutional: Negative for appetite change, chills and fever.  Respiratory: Negative for chest tightness, shortness of breath and wheezing.   Cardiovascular: Negative for chest pain and palpitations.  Gastrointestinal: Negative for abdominal pain, nausea and vomiting.       Objective    BP 102/75 (BP Location: Right Arm, Patient Position: Sitting, Cuff Size: Large)   Pulse 69   Temp 98 F (36.7 C) (Oral)   Resp 16   Wt 181 lb (82.1 kg)   SpO2 97%   BMI 25.24 kg/m    Physical Exam Vitals reviewed.  Constitutional:       Appearance: He is well-developed.  HENT:     Head: Normocephalic and atraumatic.     Right Ear: External ear normal.     Left Ear: External ear normal.     Nose: Nose normal.  Eyes:     General: No scleral icterus.    Conjunctiva/sclera: Conjunctivae normal.  Neck:     Thyroid: No thyromegaly.  Cardiovascular:     Rate and Rhythm: Normal rate and regular rhythm.     Heart sounds: Normal heart sounds.  Pulmonary:     Effort: Pulmonary effort is normal.     Breath sounds: Normal breath sounds.  Abdominal:     Palpations: Abdomen is soft.     Comments: There appears to be a small epigastric hernia which is reducible  Genitourinary:    Rectum: Guaiac result negative.  Skin:    General: Skin is warm and dry.  Neurological:     General: No focal deficit present.     Mental Status: He is alert and oriented to person, place, and time.  Psychiatric:        Mood and Affect: Mood normal.        Behavior: Behavior normal.        Thought Content: Thought content normal.        Judgment: Judgment normal.        No results found  for any visits on 10/18/19.  Assessment & Plan      1. Epigastric hernia Refer to surgery for exam and options. - Ambulatory referral to General Surgery  2. Anxiety, generalized   3. Insomnia, persistent Patient trying to cut back on sedatives and benzodiazepines.   No follow-ups on file.         Richard Wendelyn Breslow, MD  [facility] [phone] (phone) [phone] (fax)  [facility]

## 2019-10-30 ENCOUNTER — Ambulatory Visit (INDEPENDENT_AMBULATORY_CARE_PROVIDER_SITE_OTHER): Payer: BC Managed Care – PPO | Admitting: Surgery

## 2019-10-30 ENCOUNTER — Encounter: Payer: Self-pay | Admitting: Surgery

## 2019-10-30 ENCOUNTER — Ambulatory Visit: Payer: Self-pay | Admitting: Surgery

## 2019-10-30 ENCOUNTER — Other Ambulatory Visit: Payer: Self-pay

## 2019-10-30 VITALS — BP 126/78 | HR 76 | Temp 98.4°F | Ht 71.5 in | Wt 185.6 lb

## 2019-10-30 DIAGNOSIS — K439 Ventral hernia without obstruction or gangrene: Secondary | ICD-10-CM

## 2019-10-30 NOTE — Patient Instructions (Addendum)
Dr.Pabon recommends patient to keep an eye out on the area. Dr.Pabon advised patient to give our office a call if the area becomes larger. Dr.Pabon also discussed with patient the different treatment options with patient at today's visit. Patient to follow up with our office in six months.   Hernia, Adult     A hernia happens when tissue inside your body pushes out through a weak spot in your belly muscles (abdominal wall). This makes a round lump (bulge). The lump may be:  In a scar from surgery that was done in your belly (incisional hernia).  Near your belly button (umbilical hernia).  In your groin (inguinal hernia). Your groin is the area where your leg meets your lower belly (abdomen). This kind of hernia could also be: ? In your scrotum, if you are male. ? In folds of skin around your vagina, if you are male.  In your upper thigh (femoral hernia).  Inside your belly (hiatal hernia). This happens when your stomach slides above the muscle between your belly and your chest (diaphragm). If your hernia is small and it does not cause pain, you may not need treatment. If your hernia is large or it causes pain, you may need surgery. Follow these instructions at home: Activity  Avoid stretching or overusing (straining) the muscles near your hernia. Straining can happen when you: ? Lift something heavy. ? Poop (have a bowel movement).  Do not lift anything that is heavier than 10 lb (4.5 kg), or the limit that you are told, until your doctor says that it is safe.  Use the strength of your legs when you lift something heavy. Do not use only your back muscles to lift. General instructions  Do these things if told by your doctor so you do not have trouble pooping (constipation): ? Drink enough fluid to keep your pee (urine) pale yellow. ? Eat foods that are high in fiber. These include fresh fruits and vegetables, whole grains, and beans. ? Limit foods that are high in fat and  processed sugars. These include foods that are fried or sweet. ? Take medicine for trouble pooping.  When you cough, try to cough gently.  You may try to push your hernia in by very gently pressing on it when you are lying down. Do not try to force the bulge back in if it will not push in easily.  If you are overweight, work with your doctor to lose weight safely.  Do not use any products that have nicotine or tobacco in them. These include cigarettes and e-cigarettes. If you need help quitting, ask your doctor.  If you will be having surgery (hernia repair), watch your hernia for changes in shape, size, or color. Tell your doctor if you see any changes.  Take over-the-counter and prescription medicines only as told by your doctor.  Keep all follow-up visits as told by your doctor. Contact a doctor if:  You get new pain, swelling, or redness near your hernia.  You poop fewer times in a week than normal.  You have trouble pooping.  You have poop (stool) that is more dry than normal.  You have poop that is harder or larger than normal. Get help right away if:  You have a fever.  You have belly pain that gets worse.  You feel sick to your stomach (nauseous).  You throw up (vomit).  Your hernia cannot be pushed in by very gently pressing on it when you are lying down.  Do not try to force the bulge back in if it will not push in easily.  Your hernia: ? Changes in shape or size. ? Changes color. ? Feels hard or it hurts when you touch it. These symptoms may represent a serious problem that is an emergency. Do not wait to see if the symptoms will go away. Get medical help right away. Call your local emergency services (911 in the [place] Summary  A hernia happens when tissue inside your body pushes out through a weak spot in the belly muscles. This creates a bulge.  If your hernia is small and it does not hurt, you may not need treatment. If your hernia is large or it hurts,  you may need surgery.  If you will be having surgery, watch your hernia for changes in shape, size, or color. Tell your doctor about any changes. This information is not intended to replace advice given to you by your health care provider. Make sure you discuss any questions you have with your health care provider. Document Revised: 04/14/2018 Document Reviewed: 09/23/2016 Elsevier Patient Education  2020 [employer].

## 2019-10-31 ENCOUNTER — Encounter: Payer: Self-pay | Admitting: Family Medicine

## 2019-10-31 ENCOUNTER — Encounter: Payer: Self-pay | Admitting: Surgery

## 2019-10-31 NOTE — Progress Notes (Signed)
Patient ID: William Thompson, male   DOB: [birth year], 54 y.o.   MRN: [id number]  HPI William Thompson is a 54 y.o. male seen in consultation at the request of Dr. Sullivan Lone.  He reports having a bulge through the upper portion of the abdominal wall.  This has occurred in the last few months.  He is very active and uses his [place] to exercise.  He is able to perform more than 6 METS of activity without any shortness of breath or chest pain.  He experienced some mild discomfort and feels the protrusion on his abdominal wall.  No real pain.  No prior abdominal operations.  See if he did have CT of the chest and upper abdomen that I have personally reviewed at that CT I am unable to fully evaluate the abdominal wall but the upper portion shows no evidence of any hernial defects.  CBC and CMP is completely normal. He also reports having small area of discomfort on his right foot after some recent trauma. Reports some intermittent discomfort and feeling of a FB  HPI  History reviewed. No pertinent past medical history.  Past Surgical History:  Procedure Laterality Date  . VASECTOMY      Family History  Problem Relation Age of Onset  . Heart disease Father   . Arthritis Father   . Prostate cancer Father   . Prostate cancer Brother   . Peripheral Artery Disease Brother   . Stroke Mother     Social History Social History   Tobacco Use  . Smoking status: Never Smoker  . Smokeless tobacco: Never Used  Vaping Use  . Vaping Use: Never used  Substance Use Topics  . Alcohol use: No  . Drug use: No    No Known Allergies  Current Outpatient Medications  Medication Sig Dispense Refill  . acetaZOLAMIDE (DIAMOX) 500 MG capsule Take 1 capsule (500 mg total) by mouth 2 (two) times daily. 60 capsule 3  . clonazePAM (KLONOPIN) 1 MG tablet TAKE ONE TABLET TWICE A DAY IF NEEDED FOR ANXIETY 60 tablet 4  . diphenhydrAMINE (BENADRYL) 50 MG capsule Take by mouth.     . ezetimibe (ZETIA) 10 MG tablet Take 1  tablet (10 mg total) by mouth daily. 90 tablet 4  . omeprazole (PRILOSEC) 40 MG capsule TAKE 1 CAPSULE EVERY DAY 90 capsule 3  . rosuvastatin (CRESTOR) 5 MG tablet Take 1 tablet (5 mg total) by mouth daily. 90 tablet 4   No current facility-administered medications for this visit.     Review of Systems Full ROS  was asked and was negative except for the information on the HPI  Physical Exam Blood pressure 126/78, pulse 76, temperature 98.4 F (36.9 C), temperature source Oral, height 5' 11.5" (1.816 m), weight 185 lb 9.6 oz (84.2 kg), SpO2 97 %. CONSTITUTIONAL: NAD EYES: Pupils are equal, round,  Sclera are non-icteric. EARS, NOSE, MOUTH AND THROAT:Wearing a mask. Hearing is intact to voice. LYMPH NODES:  Lymph nodes in the neck are normal. RESPIRATORY:  Lungs are clear. There is normal respiratory effort, with equal breath sounds bilaterally, and without pathologic use of accessory muscles. CARDIOVASCULAR: Heart is regular without murmurs, gallops, or rubs. GI: The abdomen is  soft, nontender, and nondistended. There are no palpable masses.  There is evidence of a ventral hernia that is epigastric and is located between the umbilicus and the xiphoid process.  It is easily reducible.  No peritonitis. GU: Rectal deferred.   MUSCULOSKELETAL: Normal  muscle strength and tone. No cyanosis or edema.   SKIN: Turgor is good and there are no pathologic skin lesions or ulcers. There is a small black dot on right Heel, no infection, likely Foreign within epidermis NEUROLOGIC: Motor and sensation is grossly normal. Cranial nerves are grossly intact. PSYCH:  Oriented to person, place and time. Affect is normal.  Data Reviewed  I have personally reviewed the patient's imaging, laboratory findings and medical records.    Assessment/Plan 54 year old male with asymptomatic epigastric hernia.  DisCussed with the patient about his disease process.  Options of repair versus observation.  I specifically  quoted literature regarding observation of asymptomatic umbilical hernias and inguinal hernias.  Although we do not have definitive evidence for epigastric hernias one would assume that naturally will apply to epigastric hernias as well. He states that he is not losing sleep over this. We also reviewed the indications for further imaging and at this point I do not necessarily think that imaging will add any additional information. After a lengthy discussion with the patient about options of observation's versus repair he feels comfortable waiting for about 6 more months. He is hesitant about having repair and having to wait 6 weeks for heavy lifting and strenuous activities.  We will reexamine him in 6 months and reevaluate the situation.  If he wishes to have this repaired we will gladly do it.  I do think that repair will be open epigastric hernia repair and given that the defect is a small the decision whether or not to put mesh will be done intraoperatively. A copy of this report was sent to the referring provider.  Extensive counseling provided   Sterling Big, MD FACS General Surgeon 10/31/2019, 7:29 AM

## 2019-11-15 ENCOUNTER — Other Ambulatory Visit: Payer: Self-pay

## 2019-11-15 ENCOUNTER — Telehealth: Payer: Self-pay

## 2019-11-15 DIAGNOSIS — K439 Ventral hernia without obstruction or gangrene: Secondary | ICD-10-CM

## 2019-11-15 NOTE — Telephone Encounter (Signed)
CT scheduled 11/20/19 @ 8:45 am. Nothing to eat/drink 4 hours prior. Please go to Radiology department and pick up prep. Outpatient Imaging.   [address]  [place] [address]    Patient notified of the above appointments. Patient verbalized understanding.

## 2019-11-20 ENCOUNTER — Telehealth: Payer: Self-pay

## 2019-11-20 ENCOUNTER — Ambulatory Visit
Admission: RE | Admit: 2019-11-20 | Discharge: 2019-11-20 | Disposition: A | Discharge status: Home / Self Care | Payer: BC Managed Care – PPO | Source: Ambulatory Visit | Attending: Surgery | Admitting: Surgery

## 2019-11-20 ENCOUNTER — Other Ambulatory Visit: Payer: Self-pay

## 2019-11-20 DIAGNOSIS — D7389 Other diseases of spleen: Secondary | ICD-10-CM | POA: Diagnosis not present

## 2019-11-20 DIAGNOSIS — K429 Umbilical hernia without obstruction or gangrene: Secondary | ICD-10-CM | POA: Diagnosis not present

## 2019-11-20 DIAGNOSIS — K439 Ventral hernia without obstruction or gangrene: Secondary | ICD-10-CM | POA: Diagnosis not present

## 2019-11-20 DIAGNOSIS — K469 Unspecified abdominal hernia without obstruction or gangrene: Secondary | ICD-10-CM | POA: Diagnosis not present

## 2019-11-20 MED ORDER — IOHEXOL 300 MG/ML  SOLN
100.0000 mL | Freq: Once | INTRAMUSCULAR | Status: AC | PRN
  Administered 2019-11-20: 100 mL via INTRAVENOUS

## 2019-11-20 NOTE — Telephone Encounter (Signed)
-----   Message from Leafy Ro, MD sent at 11/20/2019  3:10 PM EST ----- Please let the pt know he does have a known small ventral hernia but no other surprises ----- Message ----- From: Interface, Rad Results In Sent: 11/20/2019   2:27 PM EST To: Leafy Ro, MD

## 2019-11-20 NOTE — Telephone Encounter (Signed)
Spoke with patient to notify him of recent CT results per Dr.Pabon. Also, made patient aware of upcoming appointment that he has scheduled with Dr.Pabon for Monday 11/27/19 at 11:15am. Patient asked that something was to come up on his imaging results or in general before his appointment, that he would like for [place] to give him a call. I reassured patient there should be no problems with his imaging results as Dr.Pabon reviewed the entire report before having the nursing staff contact patient. Patient verbalized understanding and had no further questions.

## 2019-11-27 ENCOUNTER — Ambulatory Visit: Payer: BC Managed Care – PPO | Admitting: Surgery

## 2019-11-29 DIAGNOSIS — K120 Recurrent oral aphthae: Secondary | ICD-10-CM | POA: Diagnosis not present

## 2019-12-04 ENCOUNTER — Other Ambulatory Visit: Payer: Self-pay | Admitting: Family Medicine

## 2019-12-04 ENCOUNTER — Encounter: Payer: Self-pay | Admitting: Family Medicine

## 2019-12-05 NOTE — Telephone Encounter (Signed)
See telephone encounter 10/13/2019 and office visit 10/18/2019.  Has the order been faxed?  Thanks,   -Vernona Rieger

## 2019-12-11 ENCOUNTER — Telehealth: Payer: Self-pay

## 2019-12-11 NOTE — Telephone Encounter (Signed)
Faxed over Rx as below. Patient was advised.

## 2019-12-11 NOTE — Telephone Encounter (Signed)
Copied from CRM [id number]. Topic: General - Other >> Dec 11, 2019 11:29 AM Lyn Hollingshead D wrote: PT calling to f/up on his request / He is vary upset and need a call back / please advise  I had called about 3 months ago and also talked with the nurse when I was there last month to please write a prescription to [employer] [employer] for oxygen while I am traveling at a high altitude.  I need for you to please fax the prescription to [phone] or email them at [email or web address] - it is for 2litters per minute, they also diagnosis code which would be Altitude Sickness or acute mountain sickness, as well as list Dr. Wonda Olds name and NPI. Can you please confirm when this is sent.

## 2019-12-13 ENCOUNTER — Ambulatory Visit (INDEPENDENT_AMBULATORY_CARE_PROVIDER_SITE_OTHER): Payer: BC Managed Care – PPO | Admitting: Surgery

## 2019-12-13 ENCOUNTER — Other Ambulatory Visit: Payer: Self-pay

## 2019-12-13 ENCOUNTER — Encounter: Payer: Self-pay | Admitting: Surgery

## 2019-12-13 VITALS — BP 115/79 | HR 64 | Temp 98.1°F | Ht 71.0 in | Wt 186.0 lb

## 2019-12-13 DIAGNOSIS — K439 Ventral hernia without obstruction or gangrene: Secondary | ICD-10-CM | POA: Diagnosis not present

## 2019-12-13 NOTE — Progress Notes (Signed)
Outpatient Surgical Follow Up  12/13/2019  William Thompson is an 54 y.o. male.   Chief Complaint  Patient presents with  . Routine Post Op    ventral hernia-discuss CT results    HPI: William Thompson is following up for a ventral hernia.  He will ports that he has no symptoms.  I did perform a CT scan of the abdomen pelvis that I have personally reviewed there is evidence of an umbilical hernia.  Looking closer on the side view of the CT there is a tiny defect in the umbilicus and the xiphoid.  This likely corresponds to the bulge that he feels.  History reviewed. No pertinent past medical history.  Past Surgical History:  Procedure Laterality Date  . VASECTOMY      Family History  Problem Relation Age of Onset  . Heart disease Father   . Arthritis Father   . Prostate cancer Father   . Prostate cancer Brother   . Peripheral Artery Disease Brother   . Stroke Mother     Social History:  reports that he has never smoked. He has never used smokeless tobacco. He reports that he does not drink alcohol and does not use drugs.  Allergies: No Known Allergies  Medications reviewed.    ROS Full ROS performed and is otherwise negative other than what is stated in HPI   BP 115/79   Pulse 64   Temp 98.1 F (36.7 C) (Oral)   Ht 5\' 11"  (1.803 m)   Wt 186 lb (84.4 kg)   SpO2 96%   BMI 25.94 kg/m   Physical Exam NAD , alert Abd: soft, small epigastric hernia okay between the umbilicus and the xiphoid process.  Reducible. No peritonitis, no tenderness  Assessment/Plan: 54 year old male with asymptomatic umbilical hernia and another small epigastric hernia.  Patient wishes to do watchful waiting.  No need for surgical intervention at this time  Greater than 50% of the 25 minutes  visit was spent in counseling/coordination of care   40, MD [facility] General Surgeon

## 2019-12-13 NOTE — Patient Instructions (Addendum)
Please call our office if you decide to move forward with surgery. You may continue with moderate core exercises.

## 2019-12-14 NOTE — Telephone Encounter (Signed)
Done

## 2019-12-31 ENCOUNTER — Other Ambulatory Visit: Payer: Self-pay | Admitting: Family Medicine

## 2019-12-31 NOTE — Telephone Encounter (Signed)
Requested medication (s) are due for refill today: yes  Requested medication (s) are on the active medication list: yes  Last refill:  08/29/19  Future visit scheduled: yes  Notes to clinic:  med not delegated to NT to RF   Requested Prescriptions  Pending Prescriptions Disp Refills   clonazePAM (KLONOPIN) 1 MG tablet [Pharmacy Med Name: CLONAZEPAM 1 MG TAB] 60 tablet     Sig: TAKE ONE TABLET TWICE A DAY IF NEEDED FOR ANXIETY      Not Delegated - Psychiatry:  Anxiolytics/Hypnotics Failed - 12/31/2019 10:33 AM      Failed - This refill cannot be delegated      Failed - Urine Drug Screen completed in last 360 days      Passed - Valid encounter within last 6 months    Recent Outpatient Visits           2 months ago Epigastric hernia   [facility] Maple Hudson., MD   5 months ago Annual physical exam   [facility] Maple Hudson., MD   1 year ago Annual physical exam   [facility] Maple Hudson., MD   2 years ago Annual physical exam   [facility] Maple Hudson., MD   3 years ago Annual physical exam   [facility] Maple Hudson., MD       Future Appointments             In 6 months Maple Hudson., MD [facility]

## 2020-01-29 ENCOUNTER — Other Ambulatory Visit: Payer: Self-pay | Admitting: Family Medicine

## 2020-01-29 NOTE — Telephone Encounter (Signed)
Requested medication (s) are due for refill today: yes  Requested medication (s) are on the active medication list:yes  Future visit scheduled: yes  Notes to clinic:  this refill cannot be delegated    Requested Prescriptions  Pending Prescriptions Disp Refills   clonazePAM (KLONOPIN) 1 MG tablet [Pharmacy Med Name: CLONAZEPAM 1 MG TAB] 60 tablet     Sig: TAKE ONE TABLET TWICE A DAY IF NEEDED FOR ANXIETY      Not Delegated - Psychiatry:  Anxiolytics/Hypnotics Failed - 01/29/2020  9:08 AM      Failed - This refill cannot be delegated      Failed - Urine Drug Screen completed in last 360 days      Passed - Valid encounter within last 6 months    Recent Outpatient Visits           3 months ago Epigastric hernia   [facility] Maple Hudson., MD   6 months ago Annual physical exam   [facility] Maple Hudson., MD   1 year ago Annual physical exam   [facility] Maple Hudson., MD   2 years ago Annual physical exam   [facility] Maple Hudson., MD   3 years ago Annual physical exam   [facility] Maple Hudson., MD       Future Appointments             In 5 months Maple Hudson., MD [facility]

## 2020-04-02 DIAGNOSIS — Z03818 Encounter for observation for suspected exposure to other biological agents ruled out: Secondary | ICD-10-CM | POA: Diagnosis not present

## 2020-04-02 DIAGNOSIS — Z20822 Contact with and (suspected) exposure to covid-19: Secondary | ICD-10-CM | POA: Diagnosis not present

## 2020-04-04 DIAGNOSIS — Z20822 Contact with and (suspected) exposure to covid-19: Secondary | ICD-10-CM | POA: Diagnosis not present

## 2020-04-04 DIAGNOSIS — Z03818 Encounter for observation for suspected exposure to other biological agents ruled out: Secondary | ICD-10-CM | POA: Diagnosis not present

## 2020-04-10 DIAGNOSIS — Z03818 Encounter for observation for suspected exposure to other biological agents ruled out: Secondary | ICD-10-CM | POA: Diagnosis not present

## 2020-04-10 DIAGNOSIS — Z20822 Contact with and (suspected) exposure to covid-19: Secondary | ICD-10-CM | POA: Diagnosis not present

## 2020-04-22 DIAGNOSIS — Z20822 Contact with and (suspected) exposure to covid-19: Secondary | ICD-10-CM | POA: Diagnosis not present

## 2020-04-22 DIAGNOSIS — Z03818 Encounter for observation for suspected exposure to other biological agents ruled out: Secondary | ICD-10-CM | POA: Diagnosis not present

## 2020-05-07 ENCOUNTER — Other Ambulatory Visit: Payer: Self-pay | Admitting: Family Medicine

## 2020-05-07 DIAGNOSIS — F411 Generalized anxiety disorder: Secondary | ICD-10-CM

## 2020-05-07 MED ORDER — ESCITALOPRAM OXALATE 10 MG PO TABS: 10.0000 mg | ORAL_TABLET | Freq: Every day | ORAL | 11 refills | Status: DC

## 2020-05-28 ENCOUNTER — Other Ambulatory Visit: Payer: Self-pay | Admitting: Family Medicine

## 2020-05-28 NOTE — Telephone Encounter (Signed)
Requested Prescriptions  Pending Prescriptions Disp Refills  . omeprazole (PRILOSEC) 40 MG capsule [Pharmacy Med Name: OMEPRAZOLE DR 40 MG CAP] 90 capsule 3    Sig: TAKE 1 CAPSULE EVERY DAY     Gastroenterology: Proton Pump Inhibitors Passed - 05/28/2020  1:11 PM      Passed - Valid encounter within last 12 months    Recent Outpatient Visits          7 months ago Epigastric hernia   [facility] Maple Hudson., MD   10 months ago Annual physical exam   [facility] Maple Hudson., MD   1 year ago Annual physical exam   [facility] Maple Hudson., MD   3 years ago Annual physical exam   [facility] Maple Hudson., MD   4 years ago Annual physical exam   [facility] Maple Hudson., MD      Future Appointments            In 1 month Maple Hudson., MD [facility]

## 2020-05-29 DIAGNOSIS — D225 Melanocytic nevi of trunk: Secondary | ICD-10-CM | POA: Diagnosis not present

## 2020-05-29 DIAGNOSIS — L570 Actinic keratosis: Secondary | ICD-10-CM | POA: Diagnosis not present

## 2020-05-29 DIAGNOSIS — L821 Other seborrheic keratosis: Secondary | ICD-10-CM | POA: Diagnosis not present

## 2020-05-29 DIAGNOSIS — X32XXXA Exposure to sunlight, initial encounter: Secondary | ICD-10-CM | POA: Diagnosis not present

## 2020-05-29 DIAGNOSIS — Z85828 Personal history of other malignant neoplasm of skin: Secondary | ICD-10-CM | POA: Diagnosis not present

## 2020-05-29 DIAGNOSIS — D2261 Melanocytic nevi of right upper limb, including shoulder: Secondary | ICD-10-CM | POA: Diagnosis not present

## 2020-06-29 ENCOUNTER — Other Ambulatory Visit: Payer: Self-pay | Admitting: Family Medicine

## 2020-06-29 NOTE — Telephone Encounter (Signed)
Requested medication (s) are due for refill today: yes  Requested medication (s) are on the active medication list: yes  Last refill:  01/31/20 #60 3 RF  Future visit scheduled: yes  Notes to clinic:  med not delegated to NT to RF   Requested Prescriptions  Pending Prescriptions Disp Refills   clonazePAM (KLONOPIN) 1 MG tablet [Pharmacy Med Name: CLONAZEPAM 1 MG TAB] 60 tablet     Sig: TAKE ONE TABLET TWICE A DAY IF NEEDED FOR ANXIETY      Not Delegated - Psychiatry:  Anxiolytics/Hypnotics Failed - 06/29/2020 10:32 AM      Failed - This refill cannot be delegated      Failed - Urine Drug Screen completed in last 360 days      Failed - Valid encounter within last 6 months    Recent Outpatient Visits           8 months ago Epigastric hernia   [facility] Maple Hudson., MD   11 months ago Annual physical exam   [facility] Maple Hudson., MD   1 year ago Annual physical exam   [facility] Maple Hudson., MD   3 years ago Annual physical exam   [facility] Maple Hudson., MD   4 years ago Annual physical exam   [facility] Maple Hudson., MD       Future Appointments             In 2 weeks Maple Hudson., MD [facility]

## 2020-07-18 ENCOUNTER — Other Ambulatory Visit: Payer: Self-pay

## 2020-07-18 ENCOUNTER — Ambulatory Visit (INDEPENDENT_AMBULATORY_CARE_PROVIDER_SITE_OTHER): Payer: BC Managed Care – PPO | Admitting: Family Medicine

## 2020-07-18 ENCOUNTER — Encounter: Payer: Self-pay | Admitting: Family Medicine

## 2020-07-18 VITALS — BP 127/84 | HR 80 | Temp 97.9°F | Resp 16 | Ht 71.0 in | Wt 199.0 lb

## 2020-07-18 DIAGNOSIS — Z0000 Encounter for general adult medical examination without abnormal findings: Secondary | ICD-10-CM | POA: Diagnosis not present

## 2020-07-18 DIAGNOSIS — Z125 Encounter for screening for malignant neoplasm of prostate: Secondary | ICD-10-CM | POA: Diagnosis not present

## 2020-07-18 DIAGNOSIS — Z1389 Encounter for screening for other disorder: Secondary | ICD-10-CM

## 2020-07-18 DIAGNOSIS — F411 Generalized anxiety disorder: Secondary | ICD-10-CM

## 2020-07-18 DIAGNOSIS — R5382 Chronic fatigue, unspecified: Secondary | ICD-10-CM

## 2020-07-18 DIAGNOSIS — E782 Mixed hyperlipidemia: Secondary | ICD-10-CM | POA: Diagnosis not present

## 2020-07-18 DIAGNOSIS — G4700 Insomnia, unspecified: Secondary | ICD-10-CM

## 2020-07-18 DIAGNOSIS — Z23 Encounter for immunization: Secondary | ICD-10-CM

## 2020-07-18 LAB — POCT URINALYSIS DIPSTICK
Bilirubin, UA: NEGATIVE
Blood, UA: NEGATIVE
Glucose, UA: NEGATIVE
Ketones, UA: NEGATIVE
Leukocytes, UA: NEGATIVE
Nitrite, UA: NEGATIVE
Protein, UA: NEGATIVE
Spec Grav, UA: 1.01 (ref 1.010–1.025)
Urobilinogen, UA: 0.2 E.U./dL
pH, UA: 6.5 (ref 5.0–8.0)

## 2020-07-18 NOTE — Patient Instructions (Signed)
For Counseling contact Ramond Dial at [phone].

## 2020-07-18 NOTE — Progress Notes (Signed)
I,William Thompson,acting as a scribe for William Mans, MD.,have documented all relevant documentation on the behalf of William Mans, MD,as directed by  William Mans, MD while in the presence of William Mans, MD.   Complete physical exam   Patient: William Thompson   DOB: Aug 26, 1965   55 y.o. Male  MRN: [id number] Visit Date: 07/18/2020  Today's healthcare provider: Megan Mans, MD   Chief Complaint  Patient presents with   Annual Exam   Subjective    William Thompson is a 55 y.o. male who presents today for a complete physical exam.  He reports consuming a general diet. Home exercise routine includes [place]. He generally feels well. He reports sleeping poorly. He does not have additional problems to discuss today.   HPI  Emotionally he is 80% better but continues to have problems with some depressed mood and anxiety .  He is in no way suicidal or homicidal.  He is interested in increasing his Lexapro dosage. He has mild decrease hearing in his left ear recently. History reviewed. No pertinent past medical history. Past Surgical History:  Procedure Laterality Date   VASECTOMY     Social History   Socioeconomic History   Marital status: Married    Spouse name: Not on file   Number of children: Not on file   Years of education: Not on file   Highest education level: Not on file  Occupational History   Not on file  Tobacco Use   Smoking status: Never   Smokeless tobacco: Never  Vaping Use   Vaping Use: Never used  Substance and Sexual Activity   Alcohol use: No   Drug use: No   Sexual activity: Yes    Birth control/protection: None  Other Topics Concern   Not on file  Social History Narrative   Not on file   Social Determinants of Health   Financial Resource Strain: Not on file  Food Insecurity: Not on file  Transportation Needs: Not on file  Physical Activity: Not on file  Stress: Not on file  Social Connections: Not on file   Intimate Partner Violence: Not on file   Family Status  Relation Name Status   Father  Deceased at age 98   Brother  Alive   Mother  Deceased at age 43   Sister  Alive   Brother  Alive   Brother  Alive   Family History  Problem Relation Age of Onset   Heart disease Father    Arthritis Father    Prostate cancer Father    Prostate cancer Brother    Peripheral Artery Disease Brother    Stroke Mother    No Known Allergies  Patient Care Team: William Thompson., MD as PCP - General (Family Medicine)   Medications: Outpatient Medications Prior to Visit  Medication Sig   acetaZOLAMIDE (DIAMOX) 500 MG capsule TAKE 1 CAPSULE TWICE DAILY   clonazePAM (KLONOPIN) 1 MG tablet TAKE ONE TABLET TWICE A DAY IF NEEDED FOR ANXIETY   diphenhydrAMINE (BENADRYL) 50 MG capsule Take by mouth.    escitalopram (LEXAPRO) 10 MG tablet Take 1 tablet (10 mg total) by mouth daily.   ezetimibe (ZETIA) 10 MG tablet Take 1 tablet (10 mg total) by mouth daily.   omeprazole (PRILOSEC) 40 MG capsule TAKE 1 CAPSULE EVERY DAY   rosuvastatin (CRESTOR) 5 MG tablet Take 1 tablet (5 mg total) by mouth daily.   No facility-administered medications  prior to visit.    Review of Systems  Constitutional:  Positive for fatigue.  HENT:  Positive for tinnitus.   All other systems reviewed and are negative.     Objective    BP 127/84 (BP Location: Left Arm, Patient Position: Sitting, Cuff Size: Large)   Pulse 80   Temp 97.9 F (36.6 C) (Temporal)   Resp 16   Ht 5\' 11"  (1.803 m)   Wt 199 lb (90.3 kg)   SpO2 98%   BMI 27.75 kg/m  BP Readings from Last 3 Encounters:  07/18/20 127/84  12/13/19 115/79  10/30/19 126/78   Wt Readings from Last 3 Encounters:  07/18/20 199 lb (90.3 kg)  12/13/19 186 lb (84.4 kg)  10/30/19 185 lb 9.6 oz (84.2 kg)      Physical Exam Vitals reviewed.  Constitutional:      Appearance: He is well-developed.  HENT:     Head: Normocephalic and atraumatic.     Right Ear:  Tympanic membrane, ear canal and external ear normal.     Left Ear: External ear normal. There is impacted cerumen.     Nose: Nose normal.  Eyes:     General: No scleral icterus.    Conjunctiva/sclera: Conjunctivae normal.  Neck:     Thyroid: No thyromegaly.  Cardiovascular:     Rate and Rhythm: Normal rate and regular rhythm.     Heart sounds: Normal heart sounds.  Pulmonary:     Effort: Pulmonary effort is normal.     Breath sounds: Normal breath sounds.  Abdominal:     Palpations: Abdomen is soft.  Genitourinary:    Penis: Normal.      Testes: Normal.     Rectum: Guaiac result negative.  Musculoskeletal:        General: Normal range of motion.  Skin:    General: Skin is warm and dry.  Neurological:     General: No focal deficit present.     Mental Status: He is alert and oriented to person, place, and time.  Psychiatric:        Mood and Affect: Mood normal.        Behavior: Behavior normal.        Thought Content: Thought content normal.        Judgment: Judgment normal.      Last depression screening scores PHQ 2/9 Scores 07/18/2020 07/12/2019 07/05/2018  PHQ - 2 Score 2 0 1  PHQ- 9 Score 5 2 5    Last fall risk screening Fall Risk  07/18/2020  Falls in the past year? 0  Number falls in past yr: 0  Injury with Fall? 0  Risk for fall due to : No Fall Risks  Follow up Falls evaluation completed   Last Audit-C alcohol use screening Alcohol Use Disorder Test (AUDIT) 07/18/2020  1. How often do you have a drink containing alcohol? 0  2. How many drinks containing alcohol do you have on a typical day when you are drinking? 0  3. How often do you have six or more drinks on one occasion? 0  AUDIT-C Score 0   A score of 3 or more in women, and 4 or more in men indicates increased risk for alcohol abuse, EXCEPT if all of the points are from question 1   Results for orders placed or performed in visit on 07/18/20  POCT urinalysis dipstick  Result Value Ref Range   Color,  UA Yellow    Clarity, UA Clear  Glucose, UA Negative Negative   Bilirubin, UA Negative    Ketones, UA Negative    Spec Grav, UA 1.010 1.010 - 1.025   Blood, UA Negative    pH, UA 6.5 5.0 - 8.0   Protein, UA Negative Negative   Urobilinogen, UA 0.2 0.2 or 1.0 E.U./dL   Nitrite, UA Negative    Leukocytes, UA Negative Negative    Assessment & Plan    Routine Health Maintenance and Physical Exam  Exercise Activities and Dietary recommendations  Goals   None     Immunization History  Administered Date(s) Administered   Influenza Inj Mdck Quad Pf 10/26/2016, 10/07/2017   Influenza,inj,Quad PF,6+ Mos 09/21/2018   Influenza-Unspecified 11/06/2014, 10/05/2016, 09/08/2019   PFIZER(Purple Top)SARS-COV-2 Vaccination 03/07/2019, 03/29/2019, 10/15/2019   Tdap 09/07/2007, 07/18/2020    Health Maintenance  Topic Date Due   HIV Screening  Never done   Hepatitis C Screening  Never done   Zoster Vaccines- Shingrix (1 of 2) Never done   COVID-19 Vaccine (4 - Booster for Pfizer series) 02/15/2020   INFLUENZA VACCINE  08/05/2020   COLONOSCOPY (Pts 45-62yrs Insurance coverage will need to be confirmed)  12/17/2022   TETANUS/TDAP  07/19/2030   Pneumococcal Vaccine 27-37 Years old  Aged Out   HPV VACCINES  Aged Out    Discussed health benefits of physical activity, and encouraged him to engage in regular exercise appropriate for his age and condition.  1. Annual physical exam  - Lipid panel - TSH - CBC w/Diff/Platelet - Comprehensive Metabolic Panel (CMET)  2. Mixed hyperlipidemia  - Lipid panel - TSH - CBC w/Diff/Platelet - Comprehensive Metabolic Panel (CMET)  3. Screening for prostate cancer  - PSA  4. Screening for blood or protein in urine  - POCT urinalysis dipstick--Normal  5. Need for Tdap vaccination  - Tdap vaccine greater than or equal to 7yo IM  6. Chronic fatigue  - Vitamin B12  7. Insomnia, persistent  - Vitamin B12 8.  Anxiety I think he also  has some mixed depression with this.  At this time increase Lexapro from 10 to 15 mg daily and follow-up in 2 months.  Return in about 1 year (around 07/18/2021).     I, William Mans, MD, have reviewed all documentation for this visit. The documentation on 07/23/20 for the exam, diagnosis, procedures, and orders are all accurate and complete.    Liala Codispoti Wendelyn Breslow, MD  [facility] [phone] (phone) [phone] (fax)  [facility]

## 2020-07-25 ENCOUNTER — Ambulatory Visit: Payer: BC Managed Care – PPO | Admitting: Family Medicine

## 2020-07-25 DIAGNOSIS — Z0000 Encounter for general adult medical examination without abnormal findings: Secondary | ICD-10-CM | POA: Diagnosis not present

## 2020-07-25 DIAGNOSIS — R5382 Chronic fatigue, unspecified: Secondary | ICD-10-CM | POA: Diagnosis not present

## 2020-07-25 DIAGNOSIS — Z125 Encounter for screening for malignant neoplasm of prostate: Secondary | ICD-10-CM | POA: Diagnosis not present

## 2020-07-25 DIAGNOSIS — E782 Mixed hyperlipidemia: Secondary | ICD-10-CM | POA: Diagnosis not present

## 2020-07-25 DIAGNOSIS — G4700 Insomnia, unspecified: Secondary | ICD-10-CM | POA: Diagnosis not present

## 2020-07-26 LAB — COMPREHENSIVE METABOLIC PANEL
ALT: 20 IU/L (ref 0–44)
AST: 16 IU/L (ref 0–40)
Albumin/Globulin Ratio: 2 (ref 1.2–2.2)
Albumin: 4.5 g/dL (ref 3.8–4.9)
Alkaline Phosphatase: 55 IU/L (ref 44–121)
BUN/Creatinine Ratio: 24 — ABNORMAL HIGH (ref 9–20)
BUN: 23 mg/dL (ref 6–24)
Bilirubin Total: 0.6 mg/dL (ref 0.0–1.2)
CO2: 23 mmol/L (ref 20–29)
Calcium: 9.4 mg/dL (ref 8.7–10.2)
Chloride: 103 mmol/L (ref 96–106)
Creatinine, Ser: 0.95 mg/dL (ref 0.76–1.27)
Globulin, Total: 2.3 g/dL (ref 1.5–4.5)
Glucose: 97 mg/dL (ref 65–99)
Potassium: 4.4 mmol/L (ref 3.5–5.2)
Sodium: 140 mmol/L (ref 134–144)
Total Protein: 6.8 g/dL (ref 6.0–8.5)
eGFR: 96 mL/min/{1.73_m2} (ref >59)

## 2020-07-26 LAB — LIPID PANEL
Chol/HDL Ratio: 2.5 ratio (ref 0.0–5.0)
Cholesterol, Total: 129 mg/dL (ref 100–199)
HDL: 52 mg/dL (ref >39)
LDL Chol Calc (NIH): 66 mg/dL (ref 0–99)
Triglycerides: 44 mg/dL (ref 0–149)
VLDL Cholesterol Cal: 11 mg/dL (ref 5–40)

## 2020-07-26 LAB — CBC WITH DIFFERENTIAL/PLATELET
Basophils Absolute: 0 10*3/uL (ref 0.0–0.2)
Basos: 1 %
EOS (ABSOLUTE): 0.1 10*3/uL (ref 0.0–0.4)
Eos: 2 %
Hematocrit: 46.6 % (ref 37.5–51.0)
Hemoglobin: 15.6 g/dL (ref 13.0–17.7)
Immature Grans (Abs): 0 10*3/uL (ref 0.0–0.1)
Immature Granulocytes: 0 %
Lymphocytes Absolute: 1.6 10*3/uL (ref 0.7–3.1)
Lymphs: 29 %
MCH: 29.4 pg (ref 26.6–33.0)
MCHC: 33.5 g/dL (ref 31.5–35.7)
MCV: 88 fL (ref 79–97)
Monocytes Absolute: 0.5 10*3/uL (ref 0.1–0.9)
Monocytes: 9 %
Neutrophils Absolute: 3.3 10*3/uL (ref 1.4–7.0)
Neutrophils: 59 %
Platelets: 228 10*3/uL (ref 150–450)
RBC: 5.3 x10E6/uL (ref 4.14–5.80)
RDW: 12.3 % (ref 11.6–15.4)
WBC: 5.6 10*3/uL (ref 3.4–10.8)

## 2020-07-26 LAB — TSH: TSH: 1.35 u[IU]/mL (ref 0.450–4.500)

## 2020-07-26 LAB — PSA: Prostate Specific Ag, Serum: 1.3 ng/mL (ref 0.0–4.0)

## 2020-07-26 LAB — VITAMIN B12: Vitamin B-12: 329 pg/mL (ref 232–1245)

## 2020-07-29 ENCOUNTER — Other Ambulatory Visit: Payer: Self-pay | Admitting: Family Medicine

## 2020-07-29 ENCOUNTER — Other Ambulatory Visit: Payer: Self-pay | Admitting: *Deleted

## 2020-07-29 DIAGNOSIS — F411 Generalized anxiety disorder: Secondary | ICD-10-CM

## 2020-07-29 MED ORDER — ESCITALOPRAM OXALATE 10 MG PO TABS: ORAL_TABLET | ORAL | 11 refills | Status: DC

## 2020-07-29 NOTE — Telephone Encounter (Signed)
Pt called and stated that none of his medications were refilled after his last appt/ Pt is leaving to go out of town today at noon and would like his medication called into pharmacy asap / clonazePAM (KLONOPIN) 1 MG tablet   And escitalopram (LEXAPRO) 10 MG tablet is suppose to be increased to 15MG / pt stated Dr. said he would be increasing this medication   Send to [facility] - [place] - [address]  [address] [place], [place]

## 2020-07-29 NOTE — Telephone Encounter (Signed)
Requested medication (s) are due for refill today: yes   Requested medication (s) are on the active medication list: yes  Last refill: 07/01/2020  Future visit scheduled: yes  Notes to clinic:   Pt called and stated that none of his medications were refilled after his last appt/ Pt is leaving to go out of town today at noon and would like his medication called into pharmacy asap  Requested Prescriptions  Pending Prescriptions Disp Refills   clonazePAM (KLONOPIN) 1 MG tablet [Pharmacy Med Name: CLONAZEPAM 1 MG TAB] 60 tablet     Sig: TAKE ONE TABLET TWICE A DAY IF NEEDED FOR ANXIETY      Not Delegated - Psychiatry:  Anxiolytics/Hypnotics Failed - 07/29/2020  8:56 AM      Failed - This refill cannot be delegated      Failed - Urine Drug Screen completed in last 360 days      Passed - Valid encounter within last 6 months    Recent Outpatient Visits           1 week ago Annual physical exam   [facility] Maple Hudson., MD   9 months ago Epigastric hernia   [facility] Maple Hudson., MD   1 year ago Annual physical exam   [facility] Maple Hudson., MD   2 years ago Annual physical exam   [facility] Maple Hudson., MD   3 years ago Annual physical exam   [facility] Maple Hudson., MD       Future Appointments             In 11 months Maple Hudson., MD [facility]

## 2020-07-29 NOTE — Telephone Encounter (Signed)
Please review. Thanks!  

## 2020-07-29 NOTE — Telephone Encounter (Signed)
Pt requested refill to be sent over asap. Please advise.

## 2020-07-30 ENCOUNTER — Telehealth: Payer: Self-pay | Admitting: Family Medicine

## 2020-07-30 NOTE — Telephone Encounter (Signed)
Medication was refilled on 07/29/20.

## 2020-07-30 NOTE — Telephone Encounter (Signed)
[facility] faxed refill request for the following medications:   escitalopram (LEXAPRO) 10 MG tablet    Medication was increased to 15 mg    Please advise.

## 2020-08-21 ENCOUNTER — Ambulatory Visit: Payer: BC Managed Care – PPO | Admitting: Family Medicine

## 2020-08-29 ENCOUNTER — Other Ambulatory Visit: Payer: Self-pay

## 2020-08-29 ENCOUNTER — Ambulatory Visit (INDEPENDENT_AMBULATORY_CARE_PROVIDER_SITE_OTHER): Payer: BC Managed Care – PPO | Admitting: Family Medicine

## 2020-08-29 DIAGNOSIS — Z23 Encounter for immunization: Secondary | ICD-10-CM | POA: Diagnosis not present

## 2020-09-06 NOTE — Progress Notes (Signed)
Vaccine only

## 2020-10-09 ENCOUNTER — Other Ambulatory Visit: Payer: Self-pay | Admitting: Cardiovascular Disease

## 2020-10-09 NOTE — Telephone Encounter (Signed)
Please schedule office visit for refills. Thank you! 

## 2020-10-10 NOTE — Telephone Encounter (Signed)
Patient not ready to schedule will call if needed.

## 2020-10-11 ENCOUNTER — Telehealth: Payer: Self-pay | Admitting: Family Medicine

## 2020-10-11 NOTE — Telephone Encounter (Signed)
Pt is calling and no longer needs to see dr Mariah Milling cardiologist who had prescribed his medications and would like to know if dr Sullivan Lone would take over prescribing his rosuvastatin 5 mg and generic zetia 10 mg. [facility] in [place] phone number [phone]

## 2020-10-14 ENCOUNTER — Other Ambulatory Visit: Payer: Self-pay | Admitting: Cardiovascular Disease

## 2020-10-14 MED ORDER — ROSUVASTATIN CALCIUM 5 MG PO TABS: 5.0000 mg | ORAL_TABLET | Freq: Every day | ORAL | 4 refills | Status: DC

## 2020-10-14 MED ORDER — EZETIMIBE 10 MG PO TABS: 10.0000 mg | ORAL_TABLET | Freq: Every day | ORAL | 4 refills | Status: DC

## 2020-10-14 NOTE — Telephone Encounter (Signed)
Attempted to schedule.  LMOV to call office.  ° °

## 2020-10-14 NOTE — Telephone Encounter (Signed)
Please contact pt for future appointment. Pt due for 6 month f/u. 

## 2020-10-14 NOTE — Telephone Encounter (Signed)
Rx's sent to pharmacy.  

## 2020-10-30 ENCOUNTER — Ambulatory Visit: Payer: BC Managed Care – PPO | Admitting: Family Medicine

## 2020-12-16 ENCOUNTER — Telehealth: Payer: Self-pay | Admitting: Family Medicine

## 2020-12-16 NOTE — Telephone Encounter (Signed)
Prescription done. Faxed to [facility] Fax:[phone]

## 2020-12-16 NOTE — Telephone Encounter (Signed)
Pt called requesting for his yearly prescription of Oxygen. He needs a Rx for 2 liters per minute of Oxygen sent to [facility]  Fax: [phone]   Pt wants to be notified when Rx is submitted. Please advise

## 2021-01-28 ENCOUNTER — Other Ambulatory Visit: Payer: Self-pay | Admitting: Family Medicine

## 2021-01-31 ENCOUNTER — Other Ambulatory Visit: Payer: Self-pay

## 2021-01-31 ENCOUNTER — Ambulatory Visit (INDEPENDENT_AMBULATORY_CARE_PROVIDER_SITE_OTHER): Payer: BC Managed Care – PPO | Admitting: Physician Assistant

## 2021-01-31 DIAGNOSIS — Z23 Encounter for immunization: Secondary | ICD-10-CM | POA: Diagnosis not present

## 2021-01-31 NOTE — Progress Notes (Signed)
Patient was provided with shingles vaccine today by member of [facility] staff.

## 2021-02-12 ENCOUNTER — Encounter: Payer: Self-pay | Admitting: Family Medicine

## 2021-02-12 ENCOUNTER — Ambulatory Visit (INDEPENDENT_AMBULATORY_CARE_PROVIDER_SITE_OTHER): Payer: BC Managed Care – PPO | Admitting: Family Medicine

## 2021-02-12 ENCOUNTER — Other Ambulatory Visit: Payer: Self-pay

## 2021-02-12 VITALS — BP 130/86 | HR 68 | Temp 98.3°F | Resp 16 | Wt 206.0 lb

## 2021-02-12 DIAGNOSIS — F411 Generalized anxiety disorder: Secondary | ICD-10-CM

## 2021-02-12 MED ORDER — PROPRANOLOL HCL 10 MG PO TABS: 10.0000 mg | ORAL_TABLET | Freq: Every day | ORAL | 5 refills | Status: DC | PRN

## 2021-02-12 NOTE — Progress Notes (Signed)
Established patient visit  I,April Miller,acting as a scribe for Wilhemena Durie, MD.,have documented all relevant documentation on the behalf of Wilhemena Durie, MD,as directed by  Wilhemena Durie, MD while in the presence of Wilhemena Durie, MD.   Patient: William Thompson   DOB: 03-07-1965   56 y.o. Male  MRN: [id number] Visit Date: 02/12/2021  Today's healthcare provider: Wilhemena Durie, MD   Chief Complaint  Patient presents with   Anxiety   Subjective    HPI  Patient has had worsening problems with anxiety recently.  He has been doing well for several years with not drinking much but recently with anxiety and boredom he has been drinking more.  Is not homicidal or suicidal but admits to decreased motivation to do things.  We will anhedonia. Last summer he never increase his Lexapro dose as we discussed. He is willing to go to counseling.  Physically he feels well. He is not exercising as much as he used to. Anxiety, Follow-up  He was last seen for anxiety 7 months ago. Changes made at last visit include; I think he also has some mixed depression with this.  At this time increase Lexapro from 10 to 15 mg daily and follow-up in 2 months.   He reports good compliance with treatment. He reports good tolerance of treatment. He is not having side effects. none  He feels his anxiety is moderate and Worse since last visit.  Symptoms: No chest pain No difficulty concentrating  No dizziness Yes fatigue  Yes feelings of losing control Yes insomnia  Yes irritable No palpitations  No panic attacks Yes racing thoughts  No shortness of breath No sweating  No tremors/shakes    GAD-7 Results GAD-7 Generalized Anxiety Disorder Screening Tool 02/12/2021  1. Feeling Nervous, Anxious, or on Edge 2  2. Not Being Able to Stop or Control Worrying 2  3. Worrying Too Much About Different Things 2  4. Trouble Relaxing 2  5. Being So Restless it's Hard To Sit Still 1   6. Becoming Easily Annoyed or Irritable 1  7. Feeling Afraid As If Something Awful Might Happen 0  Total GAD-7 Score 10  Difficulty At Work, Home, or Getting  Along With Others? Somewhat difficult    PHQ-9 Scores PHQ9 SCORE ONLY 02/12/2021 07/18/2020 07/12/2019  PHQ-9 Total Score 11 5 2     ---------------------------------------------------------------------------------------------------   Medications: Outpatient Medications Prior to Visit  Medication Sig   acetaZOLAMIDE (DIAMOX) 500 MG capsule TAKE 1 CAPSULE TWICE DAILY   clonazePAM (KLONOPIN) 1 MG tablet TAKE ONE TABLET TWICE A DAY IF NEEDED FOR ANXIETY   diphenhydrAMINE (BENADRYL) 50 MG capsule Take by mouth.    escitalopram (LEXAPRO) 10 MG tablet Take 1 1/2 Tablet Daily   ezetimibe (ZETIA) 10 MG tablet Take 1 tablet (10 mg total) by mouth daily.   omeprazole (PRILOSEC) 40 MG capsule TAKE 1 CAPSULE EVERY DAY   rosuvastatin (CRESTOR) 5 MG tablet Take 1 tablet (5 mg total) by mouth daily.   No facility-administered medications prior to visit.    Review of Systems  Constitutional:  Negative for appetite change, chills and fever.  Respiratory:  Negative for chest tightness, shortness of breath and wheezing.   Cardiovascular:  Negative for chest pain and palpitations.  Gastrointestinal:  Negative for abdominal pain, nausea and vomiting.       Objective    BP 130/86 (BP Location: Right Arm, Patient Position: Sitting, Cuff Size: Large)  Pulse 68    Temp 98.3 F (36.8 C) (Temporal)    Resp 16    Wt 206 lb (93.4 kg)    SpO2 94%    BMI 28.73 kg/m  Wt Readings from Last 3 Encounters:  02/12/21 206 lb (93.4 kg)  07/18/20 199 lb (90.3 kg)  12/13/19 186 lb (84.4 kg)      Physical Exam Vitals reviewed.  Constitutional:      General: He is not in acute distress.    Appearance: He is well-developed.  HENT:     Head: Normocephalic and atraumatic.     Right Ear: Hearing normal.     Left Ear: Hearing normal.     Nose: Nose  normal.  Eyes:     General: Lids are normal. No scleral icterus.       Right eye: No discharge.        Left eye: No discharge.     Conjunctiva/sclera: Conjunctivae normal.  Cardiovascular:     Rate and Rhythm: Normal rate and regular rhythm.     Heart sounds: Normal heart sounds.  Pulmonary:     Effort: Pulmonary effort is normal. No respiratory distress.  Skin:    General: Skin is warm and dry.     Findings: No lesion or rash.  Neurological:     General: No focal deficit present.     Mental Status: He is alert and oriented to person, place, and time.  Psychiatric:        Mood and Affect: Mood normal.        Speech: Speech normal.        Behavior: Behavior normal.        Thought Content: Thought content normal.        Judgment: Judgment normal.      No results found for any visits on 02/12/21.  Assessment & Plan     1. Anxiety, generalized Patient would like to try something for public speaking phobia we will try Inderal 1 hour before speaking. Started Inderal 10 mg qd prn.  - propranolol (INDERAL) 10 MG tablet; Take 1 tablet (10 mg total) by mouth daily as needed.  Dispense: 30 tablet; Refill: 5 Increase Lexapro from 10 to 15 mg daily and refer to [facility] for counseling.  I will see him back this summer for physical More than 50% 25 minute visit spent in counseling or coordination of care  Return in about 5 months (around 07/12/2021).    Prov   I, Wilhemena Durie, MD, have reviewed all documentation for this visit. The documentation on 02/14/21 for the exam, diagnosis, procedures, and orders are all accurate and complete.    Kathelene Rumberger Cranford Mon, MD  [facility] [phone] (phone) [phone] (fax)  [place]

## 2021-02-27 ENCOUNTER — Other Ambulatory Visit: Payer: Self-pay | Admitting: Family Medicine

## 2021-02-27 NOTE — Telephone Encounter (Signed)
Requested medication (s) are due for refill today:   Provider to review  Requested medication (s) are on the active medication list:   Yes  Future visit scheduled:   Yes   Last ordered: 01/29/2021 #60, 1 refill  Returned because it's a non delegated refill.   Requested Prescriptions  Pending Prescriptions Disp Refills   clonazePAM (KLONOPIN) 1 MG tablet [Pharmacy Med Name: CLONAZEPAM 1 MG TAB] 60 tablet     Sig: TAKE ONE TABLET TWICE A DAY IF NEEDED FOR ANXIETY     Not Delegated - Psychiatry: Anxiolytics/Hypnotics 2 Failed - 02/27/2021 10:44 AM      Failed - This refill cannot be delegated      Failed - Urine Drug Screen completed in last 360 days      Passed - Patient is not pregnant      Passed - Valid encounter within last 6 months    Recent Outpatient Visits           2 weeks ago Anxiety, generalized   [facility] Maple Hudson., MD   6 months ago Need for shingles vaccine   [facility] Maple Hudson., MD   7 months ago Annual physical exam   [facility] Maple Hudson., MD   1 year ago Epigastric hernia   [facility] Maple Hudson., MD   1 year ago Annual physical exam   [facility] Maple Hudson., MD       Future Appointments             In 4 months Maple Hudson., MD [facility]

## 2021-05-29 DIAGNOSIS — D225 Melanocytic nevi of trunk: Secondary | ICD-10-CM | POA: Diagnosis not present

## 2021-05-29 DIAGNOSIS — L570 Actinic keratosis: Secondary | ICD-10-CM | POA: Diagnosis not present

## 2021-05-29 DIAGNOSIS — L538 Other specified erythematous conditions: Secondary | ICD-10-CM | POA: Diagnosis not present

## 2021-05-29 DIAGNOSIS — Z85828 Personal history of other malignant neoplasm of skin: Secondary | ICD-10-CM | POA: Diagnosis not present

## 2021-05-29 DIAGNOSIS — L820 Inflamed seborrheic keratosis: Secondary | ICD-10-CM | POA: Diagnosis not present

## 2021-05-29 DIAGNOSIS — L814 Other melanin hyperpigmentation: Secondary | ICD-10-CM | POA: Diagnosis not present

## 2021-05-29 DIAGNOSIS — D2261 Melanocytic nevi of right upper limb, including shoulder: Secondary | ICD-10-CM | POA: Diagnosis not present

## 2021-06-30 ENCOUNTER — Other Ambulatory Visit: Payer: Self-pay | Admitting: Family Medicine

## 2021-07-18 NOTE — Progress Notes (Unsigned)
Complete physical exam   Patient: William Thompson   DOB: Oct 11, 1965   56 y.o. Male  MRN: [id number] Visit Date: 07/21/2021  Today's healthcare provider: Megan Mans, MD   No chief complaint on file.  Subjective    William Thompson is a 56 y.o. male who presents today for a complete physical exam.  He reports consuming a {diet types:17450} diet. {Exercise:19826} He generally feels {well/fairly well/poorly:18703}. He reports sleeping {well/fairly well/poorly:18703}. He {does/does not:200015} have additional problems to discuss today.  HPI  ***  No past medical history on file. Past Surgical History:  Procedure Laterality Date   VASECTOMY     Social History   Socioeconomic History   Marital status: Married    Spouse name: Not on file   Number of children: Not on file   Years of education: Not on file   Highest education level: Not on file  Occupational History   Not on file  Tobacco Use   Smoking status: Never   Smokeless tobacco: Never  Vaping Use   Vaping Use: Never used  Substance and Sexual Activity   Alcohol use: No   Drug use: No   Sexual activity: Yes    Birth control/protection: None  Other Topics Concern   Not on file  Social History Narrative   Not on file   Social Determinants of Health   Financial Resource Strain: Not on file  Food Insecurity: Not on file  Transportation Needs: Not on file  Physical Activity: Not on file  Stress: Not on file  Social Connections: Not on file  Intimate Partner Violence: Not on file   Family Status  Relation Name Status   Father  Deceased at age 17   Brother  Alive   Mother  Deceased at age 7   Sister  Alive   Brother  Alive   Brother  Alive   Family History  Problem Relation Age of Onset   Heart disease Father    Arthritis Father    Prostate cancer Father    Prostate cancer Brother    Peripheral Artery Disease Brother    Stroke Mother    No Known Allergies  Patient Care Team: Maple Hudson., MD as PCP - General (Family Medicine)   Medications: Outpatient Medications Prior to Visit  Medication Sig   acetaZOLAMIDE (DIAMOX) 500 MG capsule TAKE 1 CAPSULE TWICE DAILY   clonazePAM (KLONOPIN) 1 MG tablet TAKE ONE TABLET TWICE A DAY IF NEEDED FOR ANXIETY   diphenhydrAMINE (BENADRYL) 50 MG capsule Take by mouth.    escitalopram (LEXAPRO) 10 MG tablet Take 1 1/2 Tablet Daily   ezetimibe (ZETIA) 10 MG tablet Take 1 tablet (10 mg total) by mouth daily.   omeprazole (PRILOSEC) 40 MG capsule TAKE 1 CAPSULE EVERY DAY   propranolol (INDERAL) 10 MG tablet Take 1 tablet (10 mg total) by mouth daily as needed.   rosuvastatin (CRESTOR) 5 MG tablet Take 1 tablet (5 mg total) by mouth daily.   No facility-administered medications prior to visit.    Review of Systems  All other systems reviewed and are negative.   {Labs  Heme  Chem  Endocrine  Serology  Results Review (optional):23779}  Objective    There were no vitals taken for this visit. {Show previous vital signs (optional):23777}   Physical Exam  ***  Last depression screening scores    02/12/2021    3:50 PM 07/18/2020    2:13 PM 07/12/2019  3:16 PM  PHQ 2/9 Scores  PHQ - 2 Score 1 2 0  PHQ- 9 Score 11 5 2    Last fall risk screening    07/18/2020    2:13 PM  Fall Risk   Falls in the past year? 0  Number falls in past yr: 0  Injury with Fall? 0  Risk for fall due to : No Fall Risks  Follow up Falls evaluation completed   Last Audit-C alcohol use screening    07/18/2020    2:13 PM  Alcohol Use Disorder Test (AUDIT)  1. How often do you have a drink containing alcohol? 0  2. How many drinks containing alcohol do you have on a typical day when you are drinking? 0  3. How often do you have six or more drinks on one occasion? 0  AUDIT-C Score 0   A score of 3 or more in women, and 4 or more in men indicates increased risk for alcohol abuse, EXCEPT if all of the points are from question 1   No  results found for any visits on 07/21/21.  Assessment & Plan    Routine Health Maintenance and Physical Exam  Exercise Activities and Dietary recommendations  Goals   None     Immunization History  Administered Date(s) Administered   Influenza Inj Mdck Quad Pf 10/26/2016, 10/07/2017   Influenza,inj,Quad PF,6+ Mos 09/21/2018   Influenza-Unspecified 11/06/2014, 10/05/2016, 09/08/2019, 09/24/2020   Moderna Covid-19 Vaccine Bivalent Booster 55yrs & up 10/18/2020   Moderna SARS-COV2 Booster Vaccination 07/26/2020   PFIZER(Purple Top)SARS-COV-2 Vaccination 03/07/2019, 03/29/2019, 10/15/2019   Tdap 09/07/2007, 07/18/2020   Zoster Recombinat (Shingrix) 08/29/2020, 01/31/2021    Health Maintenance  Topic Date Due   HIV Screening  Never done   Hepatitis C Screening  Never done   INFLUENZA VACCINE  08/05/2021   COLONOSCOPY (Pts 45-69yrs Insurance coverage will need to be confirmed)  12/17/2022   TETANUS/TDAP  07/19/2030   COVID-19 Vaccine  Completed   Zoster Vaccines- Shingrix  Completed   HPV VACCINES  Aged Out    Discussed health benefits of physical activity, and encouraged him to engage in regular exercise appropriate for his age and condition.  ***  No follow-ups on file.     {provider attestation***:1}   07/21/2030, MD  [facility] [phone] (phone) [phone] (fax)  [facility]

## 2021-07-21 ENCOUNTER — Ambulatory Visit (INDEPENDENT_AMBULATORY_CARE_PROVIDER_SITE_OTHER): Payer: BC Managed Care – PPO | Admitting: Family Medicine

## 2021-07-21 VITALS — BP 114/77 | HR 77 | Temp 98.5°F | Ht 71.0 in | Wt 206.0 lb

## 2021-07-21 DIAGNOSIS — E782 Mixed hyperlipidemia: Secondary | ICD-10-CM

## 2021-07-21 DIAGNOSIS — Z125 Encounter for screening for malignant neoplasm of prostate: Secondary | ICD-10-CM | POA: Diagnosis not present

## 2021-07-21 DIAGNOSIS — Z0000 Encounter for general adult medical examination without abnormal findings: Secondary | ICD-10-CM

## 2021-07-21 DIAGNOSIS — Z1211 Encounter for screening for malignant neoplasm of colon: Secondary | ICD-10-CM | POA: Diagnosis not present

## 2021-07-21 DIAGNOSIS — G4700 Insomnia, unspecified: Secondary | ICD-10-CM | POA: Diagnosis not present

## 2021-07-21 DIAGNOSIS — E663 Overweight: Secondary | ICD-10-CM

## 2021-07-21 DIAGNOSIS — F411 Generalized anxiety disorder: Secondary | ICD-10-CM | POA: Diagnosis not present

## 2021-07-21 DIAGNOSIS — K219 Gastro-esophageal reflux disease without esophagitis: Secondary | ICD-10-CM | POA: Diagnosis not present

## 2021-07-21 DIAGNOSIS — Z8249 Family history of ischemic heart disease and other diseases of the circulatory system: Secondary | ICD-10-CM

## 2021-07-21 LAB — IFOBT (OCCULT BLOOD): IFOBT: NEGATIVE

## 2021-07-22 LAB — COMPREHENSIVE METABOLIC PANEL
ALT: 29 IU/L (ref 0–44)
AST: 20 IU/L (ref 0–40)
Albumin/Globulin Ratio: 2.2 (ref 1.2–2.2)
Albumin: 4.4 g/dL (ref 3.8–4.9)
Alkaline Phosphatase: 77 IU/L (ref 44–121)
BUN/Creatinine Ratio: 23 — ABNORMAL HIGH (ref 9–20)
BUN: 19 mg/dL (ref 6–24)
Bilirubin Total: 0.5 mg/dL (ref 0.0–1.2)
CO2: 19 mmol/L — ABNORMAL LOW (ref 20–29)
Calcium: 9.3 mg/dL (ref 8.7–10.2)
Chloride: 105 mmol/L (ref 96–106)
Creatinine, Ser: 0.82 mg/dL (ref 0.76–1.27)
Globulin, Total: 2 g/dL (ref 1.5–4.5)
Glucose: 95 mg/dL (ref 70–99)
Potassium: 4.3 mmol/L (ref 3.5–5.2)
Sodium: 132 mmol/L — ABNORMAL LOW (ref 134–144)
Total Protein: 6.4 g/dL (ref 6.0–8.5)
eGFR: 104 mL/min/{1.73_m2} (ref >59)

## 2021-07-22 LAB — CBC WITH DIFFERENTIAL/PLATELET
Basophils Absolute: 0 10*3/uL (ref 0.0–0.2)
Basos: 1 %
EOS (ABSOLUTE): 0.2 10*3/uL (ref 0.0–0.4)
Eos: 4 %
Hematocrit: 47.1 % (ref 37.5–51.0)
Hemoglobin: 15.4 g/dL (ref 13.0–17.7)
Immature Grans (Abs): 0 10*3/uL (ref 0.0–0.1)
Immature Granulocytes: 1 %
Lymphocytes Absolute: 1.2 10*3/uL (ref 0.7–3.1)
Lymphs: 22 %
MCH: 28.8 pg (ref 26.6–33.0)
MCHC: 32.7 g/dL (ref 31.5–35.7)
MCV: 88 fL (ref 79–97)
Monocytes Absolute: 0.5 10*3/uL (ref 0.1–0.9)
Monocytes: 9 %
Neutrophils Absolute: 3.7 10*3/uL (ref 1.4–7.0)
Neutrophils: 63 %
Platelets: 232 10*3/uL (ref 150–450)
RBC: 5.35 x10E6/uL (ref 4.14–5.80)
RDW: 11.8 % (ref 11.6–15.4)
WBC: 5.7 10*3/uL (ref 3.4–10.8)

## 2021-07-22 LAB — TSH: TSH: 0.762 u[IU]/mL (ref 0.450–4.500)

## 2021-07-22 LAB — LIPID PANEL
Chol/HDL Ratio: 2.5 ratio (ref 0.0–5.0)
Cholesterol, Total: 122 mg/dL (ref 100–199)
HDL: 48 mg/dL (ref >39)
LDL Chol Calc (NIH): 59 mg/dL (ref 0–99)
Triglycerides: 75 mg/dL (ref 0–149)
VLDL Cholesterol Cal: 15 mg/dL (ref 5–40)

## 2021-07-22 LAB — PSA: Prostate Specific Ag, Serum: 1.6 ng/mL (ref 0.0–4.0)

## 2021-08-09 DIAGNOSIS — H9201 Otalgia, right ear: Secondary | ICD-10-CM | POA: Diagnosis not present

## 2021-08-09 DIAGNOSIS — J029 Acute pharyngitis, unspecified: Secondary | ICD-10-CM | POA: Diagnosis not present

## 2021-08-09 DIAGNOSIS — R0982 Postnasal drip: Secondary | ICD-10-CM | POA: Diagnosis not present

## 2021-08-28 ENCOUNTER — Other Ambulatory Visit: Payer: Self-pay | Admitting: Family Medicine

## 2021-09-16 ENCOUNTER — Ambulatory Visit: Payer: Self-pay | Admitting: *Deleted

## 2021-09-16 NOTE — Telephone Encounter (Signed)
Summary: Typhoid Vaccine   Patient states he will be taking a trip to [place] in November and would like a prescription for the Typhoid vaccine (oral pills) to take before. Please advise patients       Called patient to review request for typhoid vaccine. No answer, LVMTCB. No contact with patient . Please advise if Rx can be given oral pills. Patient will be going on trip to [place] in November.       Reason for Disposition  Prescription request for new medicine (not a refill)  Answer Assessment - Initial Assessment Questions 1. NAME of MEDICINE: "What medicine(s) are you calling about?"     Typhoid vaccine 2. QUESTION: "What is your question?" (e.g., double dose of medicine, side effect)     Can patient get a prescription for oral pills for typhoid vaccine  3. PRESCRIBER: "Who prescribed the medicine?" Reason: if prescribed by specialist, call should be referred to that group.     na 4. SYMPTOMS: "Do you have any symptoms?" If Yes, ask: "What symptoms are you having?"  "How bad are the symptoms (e.g., mild, moderate, severe)     Patient will be going on trip to [place] in November 5. PREGNANCY:  "Is there any chance that you are pregnant?" "When was your last menstrual period?"     na  Protocols used: Medication Question Call-A-AH

## 2021-09-18 NOTE — Telephone Encounter (Signed)
Patient called to give the message below, he said he already received the message and went to the clinic on Tuesday and received the Yellow Fever, Polio Booster, a Rx for Malaria pills. However he says they told him the PCP would have to send a Rx for Typhoid pills and [facility] will be able to get them. I advised I will send this to Dr. Sullivan Lone, he verbalized understanding.

## 2021-09-18 NOTE — Telephone Encounter (Signed)
Called patient to advise. LM. Okay for PEC to advise if he returns call.

## 2021-09-22 ENCOUNTER — Other Ambulatory Visit: Payer: Self-pay | Admitting: Family Medicine

## 2021-09-22 MED ORDER — TYPHOID VACCINE PO CPDR: 1.0000 | DELAYED_RELEASE_CAPSULE | ORAL | 0 refills | Status: DC

## 2021-09-22 NOTE — Telephone Encounter (Signed)
Please advise 

## 2021-09-26 ENCOUNTER — Other Ambulatory Visit: Payer: Self-pay | Admitting: Family Medicine

## 2021-09-29 ENCOUNTER — Other Ambulatory Visit: Payer: Self-pay | Admitting: Family Medicine

## 2021-09-29 DIAGNOSIS — F411 Generalized anxiety disorder: Secondary | ICD-10-CM

## 2021-10-16 ENCOUNTER — Other Ambulatory Visit: Payer: Self-pay | Admitting: Family Medicine

## 2021-10-17 NOTE — Telephone Encounter (Signed)
Patient called in stating the reason why the pharmacy sent a request for a refill on this medication is because the patient never refrigerated it. He is leaving in a few weeks for [place] and needs this medication soon so he can start taking it as soon as possible. Please assist patient further

## 2021-10-17 NOTE — Telephone Encounter (Signed)
Requested medication (s) are due for refill today: see request  Requested medication (s) are on the active medication list: yes  Last refill:  09/22/21 #4 0 refills  Future visit scheduled: no  Notes to clinic:  medication not assigned to a protocol. Requesting to resend Rx due to patient reports he is leaving for [place] and never refrigerated medication. Now needs to start taking soon. Can you resend Rx?     Requested Prescriptions  Pending Prescriptions Disp Refills   VIVOTIF DR capsule [Pharmacy Med Name: VIVOTIF CAP] 4 capsule 0    Sig: TAKE 1 CAPSULE BY MOUTH EVERY OTHER DAY     Off-Protocol Failed - 10/16/2021  6:01 PM      Failed - Medication not assigned to a protocol, review manually.      Passed - Valid encounter within last 12 months    Recent Outpatient Visits           2 months ago Annual physical exam   [facility] Jerrol Banana., MD   8 months ago Anxiety, generalized   [facility] Jerrol Banana., MD   1 year ago Need for shingles vaccine   [facility] Jerrol Banana., MD   1 year ago Annual physical exam   [facility] Jerrol Banana., MD   2 years ago Epigastric hernia   [facility] Jerrol Banana., MD

## 2021-10-21 ENCOUNTER — Ambulatory Visit: Payer: BC Managed Care – PPO | Admitting: Family Medicine

## 2021-11-21 ENCOUNTER — Telehealth: Payer: Self-pay | Admitting: Family Medicine

## 2021-11-21 DIAGNOSIS — R197 Diarrhea, unspecified: Secondary | ICD-10-CM | POA: Diagnosis not present

## 2021-11-21 DIAGNOSIS — R509 Fever, unspecified: Secondary | ICD-10-CM | POA: Diagnosis not present

## 2021-11-21 DIAGNOSIS — E782 Mixed hyperlipidemia: Secondary | ICD-10-CM

## 2021-11-21 MED ORDER — EZETIMIBE 10 MG PO TABS: 10.0000 mg | ORAL_TABLET | Freq: Every day | ORAL | 4 refills | Status: AC

## 2021-11-21 NOTE — Telephone Encounter (Signed)
Done

## 2021-11-21 NOTE — Telephone Encounter (Signed)
[facility] faxed refill request for the following medications: ° °ezetimibe (ZETIA) 10 MG tablet  ° °Please advise. ° °

## 2021-12-01 ENCOUNTER — Other Ambulatory Visit: Payer: Self-pay | Admitting: Family Medicine

## 2021-12-01 MED ORDER — CLONAZEPAM 1 MG PO TABS
ORAL_TABLET | ORAL | 0 refills | Status: DC
Start: 2021-12-01 — End: 2021-12-30

## 2021-12-01 NOTE — Telephone Encounter (Signed)
[facility] faxed refill request for the following medications:    clonazePAM (KLONOPIN) 1 MG tablet   Please advise

## 2021-12-29 ENCOUNTER — Other Ambulatory Visit: Payer: Self-pay | Admitting: Family Medicine

## 2021-12-30 NOTE — Telephone Encounter (Signed)
Requested medication (s) are due for refill today: yes   Requested medication (s) are on the active medication list: yes   Last refill:  12/01/21 # 60  Future visit scheduled: no  Notes to clinic:  Please review for refill. Refill not delegated per protocol    Requested Prescriptions  Pending Prescriptions Disp Refills   clonazePAM (KLONOPIN) 1 MG tablet [Pharmacy Med Name: CLONAZEPAM 1 MG TAB] 60 tablet     Sig: TAKE 1 TABLET BY MOUTH TWICE DAILY AS NEEDED FOR ANXIETY     Not Delegated - Psychiatry: Anxiolytics/Hypnotics 2 Failed - 12/29/2021  8:54 PM      Failed - This refill cannot be delegated      Failed - Urine Drug Screen completed in last 360 days      Passed - Patient is not pregnant      Passed - Valid encounter within last 6 months    Recent Outpatient Visits           5 months ago Annual physical exam   [facility] Maple Hudson., MD   10 months ago Anxiety, generalized   [facility] Maple Hudson., MD   1 year ago Need for shingles vaccine   [facility] Maple Hudson., MD   1 year ago Annual physical exam   [facility] Maple Hudson., MD   2 years ago Epigastric hernia   [facility] Maple Hudson., MD

## 2022-03-24 ENCOUNTER — Other Ambulatory Visit: Payer: Self-pay | Admitting: Family Medicine

## 2022-03-25 NOTE — Telephone Encounter (Signed)
Requested medication (s) are due for refill today: Yes  Requested medication (s) are on the active medication list: Yes  Last refill:  12/30/21  Future visit scheduled: No  Notes to clinic:  See request.    Requested Prescriptions  Pending Prescriptions Disp Refills   clonazePAM (KLONOPIN) 1 MG tablet [Pharmacy Med Name: CLONAZEPAM 1 MG TAB] 60 tablet     Sig: TAKE 1 TABLET BY MOUTH TWICE DAILY AS NEEDED FOR ANXIETY     Not Delegated - Psychiatry: Anxiolytics/Hypnotics 2 Failed - 03/24/2022  1:02 PM      Failed - This refill cannot be delegated      Failed - Urine Drug Screen completed in last 360 days      Failed - Valid encounter within last 6 months    Recent Outpatient Visits           8 months ago Annual physical exam   [place] Eulas Post, MD   1 year ago Anxiety, generalized   [place] Eulas Post, MD   1 year ago Need for shingles vaccine   [facility] Eulas Post, MD   1 year ago Annual physical exam   [place] Eulas Post, MD   2 years ago Epigastric hernia   [place] Eulas Post, MD              Passed - Patient is not pregnant      Signed Prescriptions Disp Refills   omeprazole (PRILOSEC) 40 MG capsule 90 capsule 1    Sig: TAKE 1 CAPSULE EVERY DAY     Gastroenterology: Proton Pump Inhibitors Passed - 03/24/2022  1:02 PM      Passed - Valid encounter within last 12 months    Recent Outpatient Visits           8 months ago Annual physical exam   [place] Eulas Post, MD   1 year ago Anxiety, generalized   [place] Eulas Post, MD   1 year ago Need for shingles vaccine   [facility] Eulas Post, MD   1 year ago Annual physical exam   [facility] Eulas Post, MD   2 years ago Epigastric hernia   [place] Eulas Post, MD

## 2022-03-25 NOTE — Telephone Encounter (Signed)
Requested Prescriptions  Pending Prescriptions Disp Refills   clonazePAM (KLONOPIN) 1 MG tablet [Pharmacy Med Name: CLONAZEPAM 1 MG TAB] 60 tablet     Sig: TAKE 1 TABLET BY MOUTH TWICE DAILY AS NEEDED FOR ANXIETY     Not Delegated - Psychiatry: Anxiolytics/Hypnotics 2 Failed - 03/24/2022  1:02 PM      Failed - This refill cannot be delegated      Failed - Urine Drug Screen completed in last 360 days      Failed - Valid encounter within last 6 months    Recent Outpatient Visits           8 months ago Annual physical exam   [place] Eulas Post, MD   1 year ago Anxiety, generalized   [place] Eulas Post, MD   1 year ago Need for shingles vaccine   [facility] Eulas Post, MD   1 year ago Annual physical exam   [place] Eulas Post, MD   2 years ago Epigastric hernia   [place] Eulas Post, MD              Passed - Patient is not pregnant       omeprazole (PRILOSEC) 40 MG capsule [Pharmacy Med Name: OMEPRAZOLE DR 40 MG CAP] 90 capsule 1    Sig: TAKE Gerty     Gastroenterology: Proton Pump Inhibitors Passed - 03/24/2022  1:02 PM      Passed - Valid encounter within last 12 months    Recent Outpatient Visits           8 months ago Annual physical exam   [facility] Eulas Post, MD   1 year ago Anxiety, generalized   [place] Eulas Post, MD   1 year ago Need for shingles vaccine   [facility] Eulas Post, MD   1 year ago Annual physical exam   [facility] Eulas Post, MD   2 years ago Epigastric hernia   [place] Eulas Post, MD

## 2022-04-28 ENCOUNTER — Other Ambulatory Visit: Payer: Self-pay | Admitting: Family Medicine

## 2022-04-28 NOTE — Telephone Encounter (Signed)
Lmtcb to schedule an appt for fu. PEC please schedule when pt calls back.

## 2022-04-28 NOTE — Telephone Encounter (Signed)
Requested medications are due for refill today.  Unsure  Requested medications are on the active medications list.  yes  Last refill. 03/27/2022 #60 1 rf  Future visit scheduled.   no  Notes to clinic.  Refill not delegated. No PCP.    Requested Prescriptions  Pending Prescriptions Disp Refills   clonazePAM (KLONOPIN) 1 MG tablet [Pharmacy Med Name: CLONAZEPAM 1 MG TAB] 60 tablet     Sig: TAKE 1 TABLET BY MOUTH TWICE DAILY AS NEEDED FOR ANXIETY     Not Delegated - Psychiatry: Anxiolytics/Hypnotics 2 Failed - 04/28/2022  2:13 PM      Failed - This refill cannot be delegated      Failed - Urine Drug Screen completed in last 360 days      Failed - Valid encounter within last 6 months    Recent Outpatient Visits           9 months ago Annual physical exam   [facility] Bosie Clos, MD   1 year ago Anxiety, generalized   [place] [facility] Bosie Clos, MD   1 year ago Need for shingles vaccine   [facility] Bosie Clos, MD   1 year ago Annual physical exam   [facility] Bosie Clos, MD   2 years ago Epigastric hernia   [place] [facility] Bosie Clos, MD              Passed - Patient is not pregnant

## 2022-05-06 DIAGNOSIS — E663 Overweight: Secondary | ICD-10-CM | POA: Diagnosis not present

## 2022-05-06 DIAGNOSIS — M7711 Lateral epicondylitis, right elbow: Secondary | ICD-10-CM | POA: Diagnosis not present

## 2022-05-06 DIAGNOSIS — K219 Gastro-esophageal reflux disease without esophagitis: Secondary | ICD-10-CM | POA: Diagnosis not present

## 2022-05-19 DIAGNOSIS — M7711 Lateral epicondylitis, right elbow: Secondary | ICD-10-CM | POA: Diagnosis not present

## 2022-05-19 DIAGNOSIS — M7701 Medial epicondylitis, right elbow: Secondary | ICD-10-CM | POA: Diagnosis not present

## 2022-06-02 DIAGNOSIS — G5611 Other lesions of median nerve, right upper limb: Secondary | ICD-10-CM | POA: Diagnosis not present

## 2022-06-04 DIAGNOSIS — D2262 Melanocytic nevi of left upper limb, including shoulder: Secondary | ICD-10-CM | POA: Diagnosis not present

## 2022-06-04 DIAGNOSIS — Z85828 Personal history of other malignant neoplasm of skin: Secondary | ICD-10-CM | POA: Diagnosis not present

## 2022-06-04 DIAGNOSIS — L570 Actinic keratosis: Secondary | ICD-10-CM | POA: Diagnosis not present

## 2022-06-04 DIAGNOSIS — D225 Melanocytic nevi of trunk: Secondary | ICD-10-CM | POA: Diagnosis not present

## 2022-06-04 DIAGNOSIS — D2261 Melanocytic nevi of right upper limb, including shoulder: Secondary | ICD-10-CM | POA: Diagnosis not present

## 2022-07-28 DIAGNOSIS — Z0000 Encounter for general adult medical examination without abnormal findings: Secondary | ICD-10-CM | POA: Diagnosis not present

## 2022-07-28 DIAGNOSIS — Z13220 Encounter for screening for lipoid disorders: Secondary | ICD-10-CM | POA: Diagnosis not present

## 2022-07-28 DIAGNOSIS — Z125 Encounter for screening for malignant neoplasm of prostate: Secondary | ICD-10-CM | POA: Diagnosis not present

## 2022-07-28 DIAGNOSIS — Z131 Encounter for screening for diabetes mellitus: Secondary | ICD-10-CM | POA: Diagnosis not present

## 2022-08-27 DIAGNOSIS — E663 Overweight: Secondary | ICD-10-CM | POA: Diagnosis not present

## 2022-08-27 DIAGNOSIS — Z1389 Encounter for screening for other disorder: Secondary | ICD-10-CM | POA: Diagnosis not present

## 2022-08-27 DIAGNOSIS — Z0000 Encounter for general adult medical examination without abnormal findings: Secondary | ICD-10-CM | POA: Diagnosis not present

## 2022-08-27 DIAGNOSIS — Z136 Encounter for screening for cardiovascular disorders: Secondary | ICD-10-CM | POA: Diagnosis not present

## 2022-09-16 ENCOUNTER — Other Ambulatory Visit: Payer: Self-pay | Admitting: Family Medicine

## 2022-09-16 DIAGNOSIS — Z136 Encounter for screening for cardiovascular disorders: Secondary | ICD-10-CM

## 2022-09-16 DIAGNOSIS — Z0000 Encounter for general adult medical examination without abnormal findings: Secondary | ICD-10-CM

## 2022-09-22 ENCOUNTER — Ambulatory Visit
Admission: RE | Admit: 2022-09-22 | Discharge: 2022-09-22 | Disposition: A | Discharge status: Home / Self Care | Payer: Self-pay | Source: Ambulatory Visit | Attending: Family Medicine | Admitting: Family Medicine

## 2022-09-22 DIAGNOSIS — Z136 Encounter for screening for cardiovascular disorders: Secondary | ICD-10-CM | POA: Insufficient documentation

## 2022-09-22 DIAGNOSIS — Z0000 Encounter for general adult medical examination without abnormal findings: Secondary | ICD-10-CM | POA: Insufficient documentation

## 2023-03-15 ENCOUNTER — Ambulatory Visit: Payer: Self-pay

## 2023-03-15 DIAGNOSIS — Z860100 Personal history of colon polyps, unspecified: Secondary | ICD-10-CM | POA: Diagnosis not present

## 2023-03-15 DIAGNOSIS — Z09 Encounter for follow-up examination after completed treatment for conditions other than malignant neoplasm: Secondary | ICD-10-CM | POA: Diagnosis present

## 2023-03-15 DIAGNOSIS — K5730 Diverticulosis of large intestine without perforation or abscess without bleeding: Secondary | ICD-10-CM | POA: Diagnosis not present

## 2023-11-19 ENCOUNTER — Ambulatory Visit: Attending: Cardiovascular Disease | Admitting: Cardiovascular Disease

## 2023-11-19 ENCOUNTER — Encounter: Payer: Self-pay | Admitting: Cardiovascular Disease

## 2023-11-19 VITALS — BP 100/62 | HR 82 | Ht 71.0 in | Wt 185.0 lb

## 2023-11-19 DIAGNOSIS — R931 Abnormal findings on diagnostic imaging of heart and coronary circulation: Secondary | ICD-10-CM

## 2023-11-19 DIAGNOSIS — R5383 Other fatigue: Secondary | ICD-10-CM

## 2023-11-19 MED ORDER — ROSUVASTATIN CALCIUM 10 MG PO TABS: 10.0000 mg | ORAL_TABLET | Freq: Every day | ORAL | 3 refills | Status: AC

## 2023-11-19 NOTE — Patient Instructions (Addendum)
 Medication Instructions:  Please increase the crestor  up to 10 mg daily  If you need a refill on your cardiac medications before your next appointment, please call your pharmacy.   Lab work: No new labs needed  Testing/Procedures: No new testing needed  Follow-Up: At [facility], you and your health needs are our priority.  As part of our continuing mission to provide you with exceptional heart care, we have created designated Provider Care Teams.  These Care Teams include your primary Cardiologist (physician) and Advanced Practice Providers (APPs -  Physician Assistants and Nurse Practitioners) who all work together to provide you with the care you need, when you need it.  You will need a follow up appointment as needed  Providers on your designated Care Team:   Lonni Meager, NP Bernardino Bring, PA-C Cadence [place], [place]  COVID-19 Vaccine Information can be found at: [email or web address] For questions related to vaccine distribution or appointments, please email [email or web address] or call [phone]. ]

## 2023-11-19 NOTE — Progress Notes (Signed)
 Cardiology Office Note  Date:  11/19/2023   ID:  William Thompson, DOB 1965/03/11, MRN [id number]  PCP:  Bertrum Charlie CROME, MD   Chief Complaint  Patient presents with   New Patient (Initial Visit)    Ref by Dr. Bertrum for elevated coronary artery calcium  score with a family history of CAD. Patient c/o fatigue more than usual.     HPI:  William Thompson is a 58 y.o. male with past medical history of: History reviewed. No pertinent past medical history.  Anxiety/insomnia Family history of coronary artery disease coronary calcification seen on CT scan March 2019  Prior history of chest pain Who presents by referral from Dr. Bertrum for consultation of his elevated calcium  score, fatigue  Follow-up today reports doing well Issues with chronic fatigue, poor sleep Sleep study pending At lunch low energy Weight down 14 pounds on GLP1 agonist Wakes every hour to urinate  Has not been using his Pel[employer] bike at lunch as much as he used to given fatigue Active in the ER, maintains 1 acre  Prior imaging studies reviewed, pictures pulled up Coronary calcification score September 2024 Score 152  Coronary calcification score March 2019 Score 54  CT abdomen pelvis, no aortic atherosclerosis noted  Lab work reviewed Total cholesterol 118 LDL 62 A1c 5.5  EKG personally reviewed by myself on todays visit EKG Interpretation Date/Time:  Friday November 19 2023 10:00:57 EST Ventricular Rate:  82 PR Interval:  178 QRS Duration:  98 QT Interval:  368 QTC Calculation: 429 R Axis:   -22  Text Interpretation: Normal sinus rhythm Normal ECG No previous ECGs available Confirmed by Perla Lye [phone]) on 11/19/2023 10:03:54 AM   CT coronary calcium  score March 2019 Images pulled up and discussed with him, minimal punctate calcification in the LAD Score 54, 83rd percentile   Family history of cardiovascular disease  Father with MI  PMH:   has no past medical history on  file.   PSH:    Past Surgical History:  Procedure Laterality Date   VASECTOMY      Current Outpatient Medications  Medication Sig Dispense Refill   clonazePAM  (KLONOPIN ) 1 MG tablet TAKE 1 TABLET BY MOUTH TWICE DAILY AS NEEDED FOR ANXIETY 60 tablet 1   escitalopram  (LEXAPRO ) 10 MG tablet TAKE 1 AND 1/2 TABLET BY MOUTH DAILY. 45 tablet 11   ezetimibe  (ZETIA ) 10 MG tablet Take 1 tablet (10 mg total) by mouth daily. 90 tablet 4   Melatonin 300 MCG TABS Take 30 mcg by mouth at bedtime.     omeprazole  (PRILOSEC) 40 MG capsule TAKE 1 CAPSULE EVERY DAY 90 capsule 1   rosuvastatin  (CRESTOR ) 5 MG tablet TAKE ONE TABLET BY MOUTH DAILY 90 tablet 4   tirzepatide (ZEPBOUND) 12.5 MG/0.5ML Pen Inject 12.5 mg into the skin once a week.     Vilazodone HCl 20 MG TABS Take 1 tablet by mouth daily.     acetaZOLAMIDE  (DIAMOX ) 500 MG capsule TAKE 1 CAPSULE TWICE DAILY 180 capsule 3   No current facility-administered medications for this visit.    Allergies:   Patient has no known allergies.   Social History:  The patient  reports that he has never smoked. He has never used smokeless tobacco. He reports that he does not drink alcohol and does not use drugs.   Family History:   family history includes Arthritis in his father; Heart disease in his father; Peripheral Artery Disease in his brother; Prostate cancer in his brother  and father; Stroke in his mother.    Review of Systems: Review of Systems  Constitutional: Negative.   HENT: Negative.    Respiratory: Negative.    Cardiovascular: Negative.   Gastrointestinal: Negative.   Musculoskeletal: Negative.   Neurological: Negative.   Psychiatric/Behavioral: Negative.    All other systems reviewed and are negative.  PHYSICAL EXAM: VS:  BP 100/62 (BP Location: Right Arm, Patient Position: Sitting, Cuff Size: Normal)   Pulse 82   Ht 5' 11 (1.803 m)   Wt 185 lb (83.9 kg)   SpO2 96%   BMI 25.80 kg/m  , BMI Body mass index is 25.8 kg/m. GEN:  Well nourished, well developed, in no acute distress HEENT: normal Neck: no JVD, carotid bruits, or masses Cardiac: RRR; no murmurs, rubs, or gallops,no edema  Respiratory:  clear to auscultation bilaterally, normal work of breathing GI: soft, nontender, nondistended, + BS MS: no deformity or atrophy Skin: warm and dry, no rash Neuro:  Strength and sensation are intact Psych: euthymic mood, full affect  Recent Labs: No results found for requested labs within last 365 days.    Lipid Panel Lab Results  Component Value Date   CHOL 122 07/21/2021   HDL 48 07/21/2021   LDLCALC 59 07/21/2021   TRIG 75 07/21/2021      Wt Readings from Last 3 Encounters:  11/19/23 185 lb (83.9 kg)  07/21/21 206 lb (93.4 kg)  02/12/21 206 lb (93.4 kg)       ASSESSMENT AND PLAN:  Problem List Items Addressed This Visit   None Visit Diagnoses       Other fatigue    -  Primary   Relevant Orders   EKG 12-Lead (Completed)     Elevated coronary artery calcium  score       Relevant Orders   EKG 12-Lead (Completed)      Coronary calcification Images pulled up and reviewed, calcium  predominantly in the LAD, no calcium  in the aorta Denies chest pain concerning for angina Goal LDL less than 55 Recommend rosuvastatin  10 mg daily with Zetia  10 If unable to tolerate, could add bempedoic acid His weight is trending down on zepbound  Fatigue Frequent waking at night to go to the bathroom likely contributing to fatigue in the day Recommend he try to restart his exercise program as energy permits, also weight training program  Seen in consultation for Dr. Bertrum and will be referred back to his office for ongoing care of the issues detailed above  Signed, Velinda Lunger, M.D., Ph.D. [facility] [place], [place] [phone]
